# Patient Record
Sex: Female | Born: 1990
Health system: Southern US, Community
[De-identification: ages and names within clinical notes are randomized; demographics above are authoritative.]

## PROBLEM LIST (undated history)

## (undated) ENCOUNTER — Inpatient Hospital Stay (HOSPITAL_COMMUNITY): Payer: Self-pay

## (undated) ENCOUNTER — Inpatient Hospital Stay (HOSPITAL_COMMUNITY): Payer: Medicaid Other

## (undated) DIAGNOSIS — J40 Bronchitis, not specified as acute or chronic: Secondary | ICD-10-CM

## (undated) DIAGNOSIS — E119 Type 2 diabetes mellitus without complications: Secondary | ICD-10-CM

## (undated) DIAGNOSIS — E162 Hypoglycemia, unspecified: Secondary | ICD-10-CM

## (undated) HISTORY — PX: WISDOM TOOTH EXTRACTION: SHX21

## (undated) HISTORY — DX: Type 2 diabetes mellitus without complications: E11.9

## (undated) HISTORY — PX: OTHER SURGICAL HISTORY: SHX169

## (undated) NOTE — Progress Notes (Signed)
 Formatting of this note is different from the original. Subjective  Patient ID: Summer Spencer is a 14 y.o. female presenting to the Urgent Care with a chief complaint of Rash (Red itchy rash x 4 days started on right arm but now has spread to both arms & face. Pt states it is poison oak. Pt has tried otc meds with no relief.).  Poison Ivy Location:  Face arms Severity:  Moderate Onset quality:  Gradual Duration:  4 days Timing:  Constant Associated symptoms: rash    Objective  BP 114/71 (BP Location: Right arm, Patient Position: Sitting, BP Cuff Size: Adult)   Pulse 77   Temp 36.7 C (98 F) (Temporal)   Resp 20   Ht 1.575 m (5' 2)   Wt 55.8 kg (123 lb)   SpO2 100%   BMI 22.50 kg/m   Physical Exam Vitals and nursing note reviewed.  Constitutional:      Appearance: Normal appearance.  HENT:     Head: Normocephalic and atraumatic.  Eyes:     Pupils: Pupils are equal, round, and reactive to light.  Cardiovascular:     Rate and Rhythm: Normal rate and regular rhythm.  Pulmonary:     Effort: Pulmonary effort is normal.     Breath sounds: Normal breath sounds.  Skin:    General: Skin is warm and dry.     Findings: Rash present. Rash is vesicular.     Comments: Forarms and facial cheek/neck  Neurological:     General: No focal deficit present.     Mental Status: She is alert and oriented to person, place, and time.     Assessment & Plan   Assessment & Plan Rhus dermatitis  Orders:   dexAMETHasone (Decadron) injection 10 mg    In-House Lab Results:  No results found for this or any previous visit.   In-House Imaging Reads:    Procedure Documentation: Procedures   ED Course & MDM  MDM - Medical Decision Making: Home with return precautions  Patient Instructions  Pt requesting steroid shot for poison ivy on the face neck arms. Will constantly monitor  glucose and cover sugars per her sliding scale.    YOU HAVE BEEN DIAGNOSED WITH POISON IVY/OAK/SUMAC  TODAY.  TAKE THE MEDICATIONS THAT HAVE BEEN PRESCRIBED TO YOU TODAY.  YOU MAY ALSO USE CALAMINE LOTION.  IN THE FUTURE, AFTER EXPOSURE, REMOVE CLOTHING AND TAKE A SHOWER AS SOON AS POSSIBLE.  RETURN WITH ANY SIGN OF SECONDARY BACTERIAL INFECTION (REDNESS, SWELLING, DRAINAGE).  PLEASE BE AWARE THAT YOU HAVE BEEN SEEN AT AN URGENT CARE TODAY.  WE DO VERY LIMITED TESTING AND EVALUATIONS AT THIS SETTING.  WE CANNOT PERFORM ADVANCED RADIOLOGICAL TESTING AND LABORATORY TESTING.  DUE TO THIS, IF YOUR SYMPTOMS WORSEN AT ALL, DO NOT IMPROVE, OR IF YOU DEVELOP CHEST PAIN, SHORTNESS OF BREATH, ABDOMINAL PAIN, OR OTHER CONCERNING SYMPTOMS GO TO THE ER IMMEDIATELY.  FOLLOW UP WITH PCP IN 2-3 DAYS.  THE PROVIDER HAS ANSWERED ALL OF THE PATIENT'S QUESTIONS, AFTERCARE INSTRUCTIONS HAVE BEEN PROVIDED, RETURN PRECAUTIONS WERE DISCUSSED, AND THE PATIENT/GUARDIAN/CAREGIVER UNDERSTANDS AND AGREES WITH PLAN.   Electronically signed by Verneita LITTIE Boers, CRNP at 05/01/2024 10:15 PM EDT

---
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
## 2008-06-03 ENCOUNTER — Other Ambulatory Visit: Admission: RE | Admit: 2008-06-03 | Discharge: 2008-06-03 | Payer: Self-pay | Admitting: Obstetrics and Gynecology

## 2008-08-22 ENCOUNTER — Inpatient Hospital Stay (HOSPITAL_COMMUNITY): Admission: AD | Admit: 2008-08-22 | Discharge: 2008-08-22 | Payer: Self-pay | Admitting: Obstetrics & Gynecology

## 2008-12-08 ENCOUNTER — Ambulatory Visit: Payer: Self-pay | Admitting: Obstetrics and Gynecology

## 2008-12-08 ENCOUNTER — Inpatient Hospital Stay (HOSPITAL_COMMUNITY): Admission: AD | Admit: 2008-12-08 | Discharge: 2008-12-09 | Payer: Self-pay | Admitting: Obstetrics & Gynecology

## 2008-12-16 ENCOUNTER — Inpatient Hospital Stay (HOSPITAL_COMMUNITY): Admission: AD | Admit: 2008-12-16 | Discharge: 2008-12-19 | Payer: Self-pay | Admitting: Obstetrics & Gynecology

## 2008-12-16 ENCOUNTER — Ambulatory Visit: Payer: Self-pay | Admitting: Family Medicine

## 2010-12-09 LAB — URINALYSIS, ROUTINE W REFLEX MICROSCOPIC
Bilirubin Urine: NEGATIVE
Glucose, UA: NEGATIVE mg/dL
Hgb urine dipstick: NEGATIVE
Ketones, ur: NEGATIVE mg/dL
Nitrite: NEGATIVE
Protein, ur: NEGATIVE mg/dL
Specific Gravity, Urine: 1.015 (ref 1.005–1.030)
Urobilinogen, UA: 0.2 mg/dL (ref 0.0–1.0)
pH: 6.5 (ref 5.0–8.0)

## 2010-12-09 LAB — COMPREHENSIVE METABOLIC PANEL
ALT: 15 U/L (ref 0–35)
AST: 19 U/L (ref 0–37)
Albumin: 2.5 g/dL — ABNORMAL LOW (ref 3.5–5.2)
Alkaline Phosphatase: 147 U/L — ABNORMAL HIGH (ref 39–117)
BUN: 4 mg/dL — ABNORMAL LOW (ref 6–23)
CO2: 22 mEq/L (ref 19–32)
Calcium: 8.9 mg/dL (ref 8.4–10.5)
Chloride: 109 mEq/L (ref 96–112)
Creatinine, Ser: 0.4 mg/dL (ref 0.4–1.2)
GFR calc Af Amer: >60 mL/min (ref >60)
GFR calc non Af Amer: >60 mL/min (ref >60)
Glucose, Bld: 86 mg/dL (ref 70–99)
Potassium: 4 mEq/L (ref 3.5–5.1)
Sodium: 137 mEq/L (ref 135–145)
Total Bilirubin: 0.6 mg/dL (ref 0.3–1.2)
Total Protein: 4.8 g/dL — ABNORMAL LOW (ref 6.0–8.3)

## 2010-12-09 LAB — CBC
HCT: 37.5 % (ref 36.0–46.0)
HCT: 32.3 % — ABNORMAL LOW (ref 36.0–46.0)
Hemoglobin: 13.3 g/dL (ref 12.0–15.0)
Hemoglobin: 11.1 g/dL — ABNORMAL LOW (ref 12.0–15.0)
MCHC: 35.3 g/dL (ref 30.0–36.0)
MCHC: 34.5 g/dL (ref 30.0–36.0)
MCV: 99.2 fL (ref 78.0–100.0)
MCV: 99.8 fL (ref 78.0–100.0)
Platelets: 148 10*3/uL — ABNORMAL LOW (ref 150–400)
Platelets: 116 10*3/uL — ABNORMAL LOW (ref 150–400)
RBC: 3.78 MIL/uL — ABNORMAL LOW (ref 3.87–5.11)
RBC: 3.24 MIL/uL — ABNORMAL LOW (ref 3.87–5.11)
RDW: 12.9 % (ref 11.5–15.5)
RDW: 13.2 % (ref 11.5–15.5)
WBC: 10.1 10*3/uL (ref 4.0–10.5)
WBC: 7.9 10*3/uL (ref 4.0–10.5)

## 2010-12-09 LAB — URINE MICROSCOPIC-ADD ON

## 2010-12-09 LAB — GLUCOSE, CAPILLARY
Glucose-Capillary: 103 mg/dL — ABNORMAL HIGH (ref 70–99)
Glucose-Capillary: 111 mg/dL — ABNORMAL HIGH (ref 70–99)
Glucose-Capillary: 115 mg/dL — ABNORMAL HIGH (ref 70–99)
Glucose-Capillary: 137 mg/dL — ABNORMAL HIGH (ref 70–99)
Glucose-Capillary: 145 mg/dL — ABNORMAL HIGH (ref 70–99)
Glucose-Capillary: 80 mg/dL (ref 70–99)
Glucose-Capillary: 90 mg/dL (ref 70–99)
Glucose-Capillary: 91 mg/dL (ref 70–99)
Glucose-Capillary: 98 mg/dL (ref 70–99)
Glucose-Capillary: 99 mg/dL (ref 70–99)

## 2010-12-09 LAB — RPR: RPR Ser Ql: NONREACTIVE

## 2011-01-12 NOTE — Op Note (Signed)
NAMEJUANELL, Spencer             ACCOUNT NO.:  [id number]   MEDICAL RECORD NO.:  [id number]          PATIENT TYPE:  AMB   LOCATION:  [facility]                         FACILITY:  [facility]   PHYSICIAN:  M. Leda Quail, MD  DATE OF BIRTH:  [birth year]   DATE OF PROCEDURE:  03/21/2007  DATE OF DISCHARGE:                               OPERATIVE REPORT   PREOPERATIVE DIAGNOSES:  1. A 20 year old G0, white female with hymenal bands.   POSTOPERATIVE DIAGNOSIS:  1. A 20 year old G0, white female with hymenal bands.   PROCEDURE:  Hymenal band excision.   SURGEON:  M. Leda Quail, MD.   ASSISTANT:  OR staff.   ANESTHESIA:  MAC.   SPECIMENS:  None.   ESTIMATED BLOOD LOSS:  Minimal.   FLUIDS:  100 mL of LR.   URINE OUTPUT:  The patient voided right before the procedure.   COMPLICATIONS:  None.   INDICATIONS:  Ms. Summer Spencer is a 20 year old white female who has known  a history of hymenal bands.  She has become sexually active and is using  tampons and notices pulling sensation much more frequently than she did  before.  She has decided she would like to have these removed and we did  attempt to do this in the clinic.  She was intolerant of pain in the  clinic and after numbing one of the bands she was unable to complete the  procedure.  Therefore I recommended to go ahead and finished this in the  operating room.  She and her mother are in agreement and she is here for  this today.   PROCEDURE:  The patient is taken to the operating room.  MAC anesthesia  is administered by anesthesia staff without difficulty.  Legs were  positioned in [place] stirrups in the low lithotomy position.  The  perineum, inner thighs and vagina prepped in normal sterile fashion.  The patient is then draped in normal sterile fashion.  The legs slightly  lifted to a higher lithotomy position.  The posterior hymenal band that  is still remaining is elevated.  It is attached to the vaginal opening  through a  thick band.  Since I am in the OR, I decided to go ahead and  excise all of this.  A #15 blade was obtained.  This entire area of  tissue was excised sharply.  Then a 4-0 Monocryl subcuticular stitch is  used to reapproximate this tissue for hemostasis.  Cautery was then used  for complete hemostasis.  At this point the vaginal opening is smooth.  There is at least a two fingerbreadths width opening under anesthesia.  The tissue is nice and stretchy and not on any tension.  At this point  the procedure was ended.   Sponge, lap, needle, instrument counts were correct x2.  The patient  awakened from anesthesia without difficulty and taken to the recovery  room in stable condition.      Lum Keas, MD  Electronically Signed     MSM/MEDQ  D:  03/21/2007  T:  03/21/2007  Job:  [id number]

## 2011-06-04 LAB — URINALYSIS, ROUTINE W REFLEX MICROSCOPIC
Bilirubin Urine: NEGATIVE
Glucose, UA: NEGATIVE mg/dL
Hgb urine dipstick: NEGATIVE
Ketones, ur: NEGATIVE mg/dL
Nitrite: NEGATIVE
Protein, ur: NEGATIVE mg/dL
Specific Gravity, Urine: 1.015 (ref 1.005–1.030)
Urobilinogen, UA: 0.2 mg/dL (ref 0.0–1.0)
pH: 6.5 (ref 5.0–8.0)

## 2011-06-14 LAB — POCT PREGNANCY, URINE
Operator id: 268271
Preg Test, Ur: NEGATIVE

## 2011-06-14 LAB — CBC
HCT: 31.9 — ABNORMAL LOW
Hemoglobin: 11.3 — ABNORMAL LOW
MCHC: 35.4
MCV: 90.7
Platelets: 135 — ABNORMAL LOW
RBC: 3.51 — ABNORMAL LOW
RDW: 14.4 — ABNORMAL HIGH
WBC: 4.2

## 2011-08-25 LAB — OB RESULTS CONSOLE TB SKIN TEST: CHL TB SkinTest: NEGATIVE

## 2011-08-29 ENCOUNTER — Inpatient Hospital Stay (HOSPITAL_COMMUNITY)
Admission: AD | Admit: 2011-08-29 | Discharge: 2011-08-29 | Disposition: A | Discharge status: Home / Self Care | Payer: Medicaid Other | Source: Ambulatory Visit | Attending: Obstetrics & Gynecology | Admitting: Obstetrics & Gynecology

## 2011-08-29 ENCOUNTER — Inpatient Hospital Stay (HOSPITAL_COMMUNITY): Payer: Medicaid Other

## 2011-08-29 ENCOUNTER — Encounter (HOSPITAL_COMMUNITY): Payer: Self-pay

## 2011-08-29 DIAGNOSIS — N760 Acute vaginitis: Secondary | ICD-10-CM | POA: Insufficient documentation

## 2011-08-29 DIAGNOSIS — B9689 Other specified bacterial agents as the cause of diseases classified elsewhere: Secondary | ICD-10-CM | POA: Insufficient documentation

## 2011-08-29 DIAGNOSIS — A499 Bacterial infection, unspecified: Secondary | ICD-10-CM | POA: Insufficient documentation

## 2011-08-29 DIAGNOSIS — O2390 Unspecified genitourinary tract infection in pregnancy, unspecified trimester: Secondary | ICD-10-CM | POA: Insufficient documentation

## 2011-08-29 DIAGNOSIS — O209 Hemorrhage in early pregnancy, unspecified: Secondary | ICD-10-CM | POA: Insufficient documentation

## 2011-08-29 LAB — WET PREP, GENITAL
Trich, Wet Prep: NONE SEEN
Yeast Wet Prep HPF POC: NONE SEEN

## 2011-08-29 LAB — URINALYSIS, ROUTINE W REFLEX MICROSCOPIC
Bilirubin Urine: NEGATIVE
Glucose, UA: NEGATIVE mg/dL
Hgb urine dipstick: NEGATIVE
Ketones, ur: NEGATIVE mg/dL
Leukocytes, UA: NEGATIVE
Nitrite: NEGATIVE
Protein, ur: NEGATIVE mg/dL
Specific Gravity, Urine: 1.015 (ref 1.005–1.030)
Urobilinogen, UA: 0.2 mg/dL (ref 0.0–1.0)
pH: 6 (ref 5.0–8.0)

## 2011-08-29 LAB — ABO/RH: ABO/RH(D): A POS

## 2011-08-29 LAB — POCT PREGNANCY, URINE: Preg Test, Ur: POSITIVE

## 2011-08-29 MED ORDER — METRONIDAZOLE 500 MG PO TABS: 500.0000 mg | ORAL_TABLET | Freq: Two times a day (BID) | ORAL | Status: AC

## 2011-08-29 NOTE — ED Provider Notes (Signed)
History     Chief Complaint  Patient presents with  . Vaginal Bleeding    when wiping only    HPI  Onset of vaginal bleeding around 1:00 a.m, continued until 1200, only when wiping after urination.  Denies pelvic pain.  Received prenatal care at [facility]; 8 weeks, had an ultrasound last week by Dr. Emelda Fear everything wnl.   History reviewed. No pertinent past medical history.  Past Surgical History  Procedure Date  . Wisdom tooth extraction   . Hymen     History reviewed. No pertinent family history.  History  Substance Use Topics  . Smoking status: Never Smoker   . Smokeless tobacco: Not on file  . Alcohol Use: No    Allergies:  Allergies  Allergen Reactions  . Septra (Bactrim) Rash    Prescriptions prior to admission  Medication Sig Dispense Refill  . ondansetron (ZOFRAN) 4 MG tablet Take 4 mg by mouth daily as needed. nausea       . Prenatal Vit-Fe Fumarate-FA (PRENATAL MULTIVITAMIN) TABS Take 1 tablet by mouth daily.          Review of Systems  Gastrointestinal: Positive for abdominal pain ( cramping).  Genitourinary:       Vaginal bleeding  All other systems reviewed and are negative.   Physical Exam   Blood pressure 116/81, pulse 80, temperature 98.6 F (37 C), temperature source Oral, resp. rate 16, height 5\' 2"  (1.575 m), weight 57.607 kg (127 lb).  Physical Exam  Constitutional: She is oriented to person, place, and time. She appears well-developed and well-nourished.  HENT:  Head: Normocephalic.  Neck: Normal range of motion. Neck supple.  Cardiovascular: Normal rate and regular rhythm.   Respiratory: Effort normal and breath sounds normal.  GI: Soft. She exhibits no mass. There is no tenderness. There is no guarding.  Genitourinary: Cervix exhibits motion tenderness. Vaginal discharge (white, creamy; frothy) found.       +fishy odor  Neurological: She is alert and oriented to person, place, and time.  Skin: Skin is warm and dry.     MAU Course  Procedures  [place] IUP at 8wks 3 days; +fetal heart rate  Results for orders placed during the hospital encounter of 08/29/11 (from the past 24 hour(s))  URINALYSIS, ROUTINE W REFLEX MICROSCOPIC     Status: Normal   Collection Time   08/29/11  6:20 PM      Component Value Range   Color, Urine YELLOW  YELLOW    APPearance CLEAR  CLEAR    Specific Gravity, Urine 1.015  1.005 - 1.030    pH 6.0  5.0 - 8.0    Glucose, UA NEGATIVE  NEGATIVE (mg/dL)   Hgb urine dipstick NEGATIVE  NEGATIVE    Bilirubin Urine NEGATIVE  NEGATIVE    Ketones, ur NEGATIVE  NEGATIVE (mg/dL)   Protein, ur NEGATIVE  NEGATIVE (mg/dL)   Urobilinogen, UA 0.2  0.0 - 1.0 (mg/dL)   Nitrite NEGATIVE  NEGATIVE    Leukocytes, UA NEGATIVE  NEGATIVE   POCT PREGNANCY, URINE     Status: Normal   Collection Time   08/29/11  6:24 PM      Component Value Range   Preg Test, Ur POSITIVE    ABO/RH     Status: Normal   Collection Time   08/29/11  7:30 PM      Component Value Range   ABO/RH(D) A POS    WET PREP, GENITAL     Status: Abnormal  Collection Time   08/29/11  9:00 PM      Component Value Range   Yeast, Wet Prep NONE SEEN  NONE SEEN    Trich, Wet Prep NONE SEEN  NONE SEEN    Clue Cells, Wet Prep FEW (*) NONE SEEN    WBC, Wet Prep HPF POC TOO NUMEROUS TO COUNT (*) NONE SEEN      Assessment and Plan  Intrauterine Pregnancy Bleeding During Pregnancy Bacterial Vaginosis  Plan: DC to home Flagyl Keep scheduled appointment  [facility] 08/29/2011, 9:04 PM

## 2011-08-29 NOTE — Progress Notes (Signed)
Onset of vaginal bleeding around 1:00 a.m. When wiping after urination, no pain. 8 weeks, had an ultrasound last week by Dr. Emelda Fear everything wnl

## 2011-08-29 NOTE — Progress Notes (Signed)
Roney Marion, CNM at bedside.  Assessment done and poc discussed with pt.  Pelvic done.

## 2011-08-29 NOTE — Progress Notes (Signed)
V. Smith, CNM at bedside.  Assessment done and poc discussed with pt.  

## 2011-08-29 NOTE — Progress Notes (Signed)
Pt notified of waiting for [place].  Is next in line.

## 2011-08-29 NOTE — Progress Notes (Signed)
SSE per CNM.  Wet prep and  Cultures collected.

## 2011-08-29 NOTE — Progress Notes (Signed)
Pt states she always has vaginal discharge.

## 2011-08-30 LAB — GC/CHLAMYDIA PROBE AMP, GENITAL
Chlamydia, DNA Probe: NEGATIVE
GC Probe Amp, Genital: NEGATIVE

## 2011-08-30 LAB — OB RESULTS CONSOLE HEPATITIS B SURFACE ANTIGEN: Hepatitis B Surface Ag: NEGATIVE

## 2011-08-30 LAB — OB RESULTS CONSOLE RUBELLA ANTIBODY, IGM: Rubella: IMMUNE

## 2011-08-30 LAB — OB RESULTS CONSOLE HIV ANTIBODY (ROUTINE TESTING): HIV: NONREACTIVE

## 2011-08-31 NOTE — L&D Delivery Note (Signed)
I have seen and examined this patient and I agree with the above. Summer Spencer 3:26 AM 03/30/2012

## 2011-08-31 NOTE — L&D Delivery Note (Signed)
Delivery Note At 2:47 AM a viable female was delivered via Vaginal, Spontaneous Delivery (Presentation: ;OA  ).  APGAR: 5, 8; weight 9 lb 14.4 oz (4490 g).   Placenta status: Intact, Spontaneous.  Cord: 3 vessel with the following complications: shoulder dystocia x , McRoberts, suprapubic pressure used, and pt pushing w/ctx    Anesthesia: None  Episiotomy: none Lacerations: Lt labial abrasion, 1st degree perineal-hemostatic, unrepaired  Est. Blood Loss (mL): 200  Mom to postpartum.  Baby to nursery-stable. Philipp Deputy CNM present at delivery Lawernce Pitts 03/30/2012, 3:05 AM

## 2011-09-03 ENCOUNTER — Other Ambulatory Visit: Payer: Self-pay | Admitting: Obstetrics and Gynecology

## 2011-10-09 ENCOUNTER — Inpatient Hospital Stay (HOSPITAL_COMMUNITY)
Admission: AD | Admit: 2011-10-09 | Discharge: 2011-10-09 | Disposition: A | Discharge status: Home / Self Care | Payer: Medicaid Other | Source: Ambulatory Visit | Attending: Obstetrics & Gynecology | Admitting: Obstetrics & Gynecology

## 2011-10-09 ENCOUNTER — Encounter (HOSPITAL_COMMUNITY): Payer: Self-pay

## 2011-10-09 DIAGNOSIS — O99891 Other specified diseases and conditions complicating pregnancy: Secondary | ICD-10-CM | POA: Insufficient documentation

## 2011-10-09 DIAGNOSIS — J029 Acute pharyngitis, unspecified: Secondary | ICD-10-CM | POA: Insufficient documentation

## 2011-10-09 HISTORY — DX: Hypoglycemia, unspecified: E16.2

## 2011-10-09 MED ORDER — AMOXICILLIN-POT CLAVULANATE 875-125 MG PO TABS
1.0000 | ORAL_TABLET | Freq: Once | ORAL | Status: AC
  Administered 2011-10-09: 1 via ORAL
  Filled 2011-10-09: qty 1

## 2011-10-09 MED ORDER — AMOXICILLIN-POT CLAVULANATE 875-125 MG PO TABS: 1.0000 | ORAL_TABLET | Freq: Two times a day (BID) | ORAL | Status: AC

## 2011-10-09 NOTE — ED Provider Notes (Signed)
History   Pt presents today c/o a sore throat that began yesterday. She states it feels extremely "raw" and she has been having chills. She states she has been taking tylenol so she is unsure if she has had a fever. She denies abd pain, vag dc, bleeding, or any other sx at this time.  Chief Complaint  Patient presents with  . Sore Throat   HPI  OB History    Grav Para Term Preterm Abortions TAB SAB Ect Mult Living   2 1 1  0 0 0 0 0 0 1      History reviewed. No pertinent past medical history.  Past Surgical History  Procedure Date  . Wisdom tooth extraction   . Hymen     No family history on file.  History  Substance Use Topics  . Smoking status: Never Smoker   . Smokeless tobacco: Not on file  . Alcohol Use: No    Allergies:  Allergies  Allergen Reactions  . Septra (Bactrim) Rash    Prescriptions prior to admission  Medication Sig Dispense Refill  . ondansetron (ZOFRAN) 4 MG tablet Take 4 mg by mouth daily as needed. nausea       . Prenatal Vit-Fe Fumarate-FA (PRENATAL MULTIVITAMIN) TABS Take 1 tablet by mouth daily.          Review of Systems  Constitutional: Positive for chills. Negative for fever.  HENT: Positive for sore throat. Negative for hearing loss, nosebleeds and congestion.   Eyes: Negative for blurred vision and double vision.  Respiratory: Negative for cough, hemoptysis, sputum production, shortness of breath and stridor.   Cardiovascular: Negative for chest pain and palpitations.  Gastrointestinal: Negative for nausea, vomiting, abdominal pain, diarrhea and constipation.  Genitourinary: Negative for dysuria, urgency, frequency and hematuria.  Neurological: Negative for dizziness and headaches.  Psychiatric/Behavioral: Negative for depression and suicidal ideas.   Physical Exam   Blood pressure 116/63, pulse 105, temperature 98.7 F (37.1 C), temperature source Oral, resp. rate 16, height 5\' 2"  (1.575 m), weight 128 lb (58.06 kg).  Physical  Exam  Nursing note and vitals reviewed. Constitutional: She is oriented to person, place, and time. She appears well-developed and well-nourished. No distress.  HENT:  Head: Normocephalic and atraumatic.  Mouth/Throat: Uvula is midline. No oral lesions. No dental abscesses. Oropharyngeal exudate and posterior oropharyngeal erythema present. No posterior oropharyngeal edema or tonsillar abscesses.       Posterior pharynx with cobblestoning appearance.   Cardiovascular: Normal rate, regular rhythm and normal heart sounds.  Exam reveals no gallop and no friction rub.   No murmur heard. Respiratory: Effort normal and breath sounds normal. No respiratory distress. She has no wheezes. She has no rales. She exhibits no tenderness.  GI: Soft. She exhibits no distension and no mass. There is no tenderness. There is no rebound and no guarding.  Neurological: She is alert and oriented to person, place, and time.  Skin: Skin is warm and dry. She is not diaphoretic.  Psychiatric: She has a normal mood and affect. Her behavior is normal. Judgment and thought content normal.    MAU Course  Procedures    Assessment and Plan  Pharyngitis: discussed with pt at length. She likely has Strep. Pharyngitis. Will tx with Augmentin 875 bid x 10 days. Her first dose was given in the MAU. She has f/u scheduled. Discussed diet, activity, risks, and precautions.  Clinton Gallant. Serigne Kubicek III, DrHSc, MPAS, PA-C  10/09/2011, 6:23 PM   Henrietta Hoover, PA  10/09/11 1829 

## 2011-10-09 NOTE — Progress Notes (Signed)
Onset of sore throat since yesterday, chills, body aches. 14 weeks.

## 2011-10-09 NOTE — ED Notes (Signed)
Henrietta Hoover PA into see patient, visualization of throat.

## 2011-10-14 ENCOUNTER — Inpatient Hospital Stay (HOSPITAL_COMMUNITY)
Admission: AD | Admit: 2011-10-14 | Discharge: 2011-10-14 | Disposition: A | Discharge status: Home / Self Care | Payer: Medicaid Other | Source: Ambulatory Visit | Attending: Obstetrics & Gynecology | Admitting: Obstetrics & Gynecology

## 2011-10-14 ENCOUNTER — Encounter (HOSPITAL_COMMUNITY): Payer: Self-pay

## 2011-10-14 ENCOUNTER — Inpatient Hospital Stay (HOSPITAL_COMMUNITY): Payer: Medicaid Other

## 2011-10-14 DIAGNOSIS — R51 Headache: Secondary | ICD-10-CM

## 2011-10-14 DIAGNOSIS — R519 Headache, unspecified: Secondary | ICD-10-CM

## 2011-10-14 DIAGNOSIS — G43909 Migraine, unspecified, not intractable, without status migrainosus: Secondary | ICD-10-CM | POA: Insufficient documentation

## 2011-10-14 DIAGNOSIS — O26899 Other specified pregnancy related conditions, unspecified trimester: Secondary | ICD-10-CM

## 2011-10-14 DIAGNOSIS — O99891 Other specified diseases and conditions complicating pregnancy: Secondary | ICD-10-CM | POA: Insufficient documentation

## 2011-10-14 MED ORDER — SUMATRIPTAN SUCCINATE 50 MG PO TABS
50.0000 mg | ORAL_TABLET | ORAL | Status: DC | PRN
  Filled 2011-10-14: qty 1

## 2011-10-14 MED ORDER — SUMATRIPTAN SUCCINATE 50 MG PO TABS
50.0000 mg | ORAL_TABLET | Freq: Once | ORAL | Status: AC
  Administered 2011-10-14: 50 mg via ORAL

## 2011-10-14 MED ORDER — SUMATRIPTAN SUCCINATE 50 MG PO TABS
50.0000 mg | ORAL_TABLET | Freq: Once | ORAL | Status: AC
  Administered 2011-10-14: 50 mg via ORAL
  Filled 2011-10-14: qty 1

## 2011-10-14 MED ORDER — ONDANSETRON 8 MG PO TBDP
8.0000 mg | ORAL_TABLET | Freq: Once | ORAL | Status: AC
  Administered 2011-10-14: 8 mg via ORAL
  Filled 2011-10-14: qty 1

## 2011-10-14 MED ORDER — HYDROMORPHONE HCL PF 1 MG/ML IJ SOLN
1.0000 mg | Freq: Once | INTRAMUSCULAR | Status: AC
  Administered 2011-10-14: 1 mg via INTRAMUSCULAR
  Filled 2011-10-14: qty 1

## 2011-10-14 NOTE — Progress Notes (Signed)
Patient states she has been having headaches on the right side of her head behind the right eye for about 3 weeks, getting worse and more frequent. Throbbing and worse when moving. Has vomiting at least once a day, no a new symptom.

## 2011-10-14 NOTE — Discharge Instructions (Signed)
Headache, General, Unknown Cause The specific cause of your headache may not have been found today. There are many causes and types of headache. A few common ones are:  Tension headache.   Migraine.   Infections (examples: dental and sinus infections).   Bone and/or joint problems in the neck or jaw.   Depression.   Eye problems.  These headaches are not life threatening.  Headaches can sometimes be diagnosed by a patient history and a physical exam. Sometimes, lab and imaging studies (such as x-ray and/or CT scan) are used to rule out more serious problems. In some cases, a spinal tap (lumbar puncture) may be requested. There are many times when your exam and tests may be normal on the first visit even when there is a serious problem causing your headaches. Because of that, it is very important to follow up with your doctor or local clinic for further evaluation. FINDING OUT THE RESULTS OF TESTS  If a radiology test was performed, a radiologist will review your results.   You will be contacted by the emergency department or your physician if any test results require a change in your treatment plan.   Not all test results may be available during your visit. If your test results are not back during the visit, make an appointment with your caregiver to find out the results. Do not assume everything is normal if you have not heard from your caregiver or the medical facility. It is important for you to follow up on all of your test results.  HOME CARE INSTRUCTIONS   Keep follow-up appointments with your caregiver, or any specialist referral.   Only take over-the-counter or prescription medicines for pain, discomfort, or fever as directed by your caregiver.   Biofeedback, massage, or other relaxation techniques may be helpful.   Ice packs or heat applied to the head and neck can be used. Do this three to four times per day, or as needed.   Call your doctor if you have any questions or  concerns.   If you smoke, you should quit.  SEEK MEDICAL CARE IF:   You develop problems with medications prescribed.   You do not respond to or obtain relief from medications.   You have a change from the usual headache.   You develop nausea or vomiting.  SEEK IMMEDIATE MEDICAL CARE IF:   If your headache becomes severe.   You have an unexplained oral temperature above 102 F (38.9 C), or as your caregiver suggests.   You have a stiff neck.   You have loss of vision.   You have muscular weakness.   You have loss of muscular control.   You develop severe symptoms different from your first symptoms.   You start losing your balance or have trouble walking.   You feel faint or pass out.  MAKE SURE YOU:   Understand these instructions.   Will watch your condition.   Will get help right away if you are not doing well or get worse.  Document Released: 08/16/2005 Document Revised: 04/28/2011 Document Reviewed: 04/04/2008 [facility] Patient Information 2012 [place], [place].\  CALL YOUR DOCTOR SHOULD YOU HAVE INCREASED PAIN, FACIAL PAIN OR CONGESTION

## 2011-10-14 NOTE — ED Provider Notes (Signed)
History     Chief Complaint  Patient presents with  . Headache   HPI Summer Spencer is 21 y.o. G2P1001 [redacted]w[redacted]d weeks presenting with headache X 3 weeks, now has daily headache and at night it is throbbing.  Reports having 3 migraines "in her life".    Has taken 2 tylenol tabs today without relief.  Patient of [facility].  Called the office, came here because it was closer.  Still has morning sickness if she eats too early.  Points to right side of her head.      Past Medical History  Diagnosis Date  . Hypoglycemia     Past Surgical History  Procedure Date  . Wisdom tooth extraction   . Hymen     Family History  Problem Relation Age of Onset  . Anesthesia problems Neg Hx     History  Substance Use Topics  . Smoking status: Former Smoker    Quit date: 08/08/2011  . Smokeless tobacco: Not on file  . Alcohol Use: No    Allergies:  Allergies  Allergen Reactions  . Septra (Bactrim) Rash    Prescriptions prior to admission  Medication Sig Dispense Refill  . Acetaminophen (TYLENOL SORE THROAT DAYTIME) 167 MG/5ML LIQD Take 30 mLs by mouth daily as needed. Sore throat      . amoxicillin-clavulanate (AUGMENTIN) 875-125 MG per tablet Take 1 tablet by mouth 2 (two) times daily.  20 tablet  0  . ondansetron (ZOFRAN) 4 MG tablet Take 4 mg by mouth daily as needed. nausea       . Prenatal Vit-Fe Fumarate-FA (PRENATAL MULTIVITAMIN) TABS Take 1 tablet by mouth daily.          Review of Systems  Eyes: Positive for photophobia.  Gastrointestinal: Positive for vomiting (morning sickness).  Neurological: Positive for headaches (right temporal ).   Physical Exam   Blood pressure 122/77, pulse 101, temperature 98.4 F (36.9 C), temperature source Oral, resp. rate 16, height 5\' 3"  (1.6 m), weight 58.695 kg (129 lb 6.4 oz), SpO2 100.00%.  Physical Exam  Constitutional: She is oriented to person, place, and time. She appears well-developed and well-nourished.   Uncomfortable,squinting  Eyes:       photophobic  Neurological: She is alert and oriented to person, place, and time. No cranial nerve deficit. Coordination normal.  Skin: Skin is warm and dry.   CT HEAD WITHOUT CONTRAST  Technique: Contiguous axial images were obtained from the base of  the skull through the vertex without contrast.  Comparison: None  Findings:  Abdomen shielded.  Normal ventricular morphology.  No midline shift or mass effect.  Normal appearance of brain parenchyma.  No intracranial hemorrhage, mass lesion, or evidence of acute  infarction.  No extra-axial fluid collections.  Osseous structures unremarkable.  Partial opacification of ethmoid air cells and sphenoid sinus with  sub total opacification of the right maxillary sinus apex question  sinusitis.  IMPRESSION:  No acute intracranial abnormalities.  Question right maxillary sinusitis.  Original Report Authenticated By: Lollie Marrow, M.D.       MAU Course  Procedures  MDM 16:00 Discussed patient's sxs and physical findings with Dr. Marice Potter.  Order given for Imitrex 50 mg po and Dilaudid 1mg  IM now.  May repeat Imitrex in 2 hours.  If not improvement, consider CT.  17:10 Patient called out reporting vomiting.  Zofran ordered. 17:40  Patient states her headache is not better and her head feels heavy.  I reported this  to Dr. Jolayne Panther.  Give second imitrex when due and order CT without contrast.  18:30  Can give Imitrex now, asked patient if she would like second dose.  She prefers to wait.   19:06  Reported CT results to Dr. Jolayne Panther.  May discharge to home.  Will offer second imitrex now.  Patient would like the second Imitrex.  Caution if she gets facial pain or sinus congestion, she need to call [facility] for treatment.  Patient states she was treated this weekend for strept throat with Amoxicillin Assessment and Plan  A:  Migraine headache in 2nd trimester pregnancy  P:  Instructed to call her doctor  for recurrent headache or sinus congestion/facial pain      En couraged to stay well hydrated. Durrell Barajas,EVE M 10/14/2011, 3:46 PM   Matt Holmes, NP 10/14/11 1915

## 2011-11-12 ENCOUNTER — Other Ambulatory Visit: Payer: Self-pay | Admitting: Obstetrics and Gynecology

## 2011-12-18 ENCOUNTER — Encounter (HOSPITAL_COMMUNITY): Payer: Self-pay

## 2011-12-18 ENCOUNTER — Inpatient Hospital Stay (HOSPITAL_COMMUNITY)
Admission: AD | Admit: 2011-12-18 | Discharge: 2011-12-18 | Disposition: A | Discharge status: Home / Self Care | Payer: Medicaid Other | Source: Ambulatory Visit | Attending: Obstetrics & Gynecology | Admitting: Obstetrics & Gynecology

## 2011-12-18 DIAGNOSIS — N6452 Nipple discharge: Secondary | ICD-10-CM

## 2011-12-18 DIAGNOSIS — N6459 Other signs and symptoms in breast: Secondary | ICD-10-CM

## 2011-12-18 NOTE — MAU Provider Note (Signed)
  History     CSN: [id number]  Arrival date and time: 12/18/11 1746   First Provider Initiated Contact with Patient 12/18/11 1810      Chief Complaint  Patient presents with  . Breast Problem   HPI  Pt here with reports of blood from left breast today.  Denies breast pain or mass.  No bleeding at this time.    Past Medical History  Diagnosis Date  . Hypoglycemia     Past Surgical History  Procedure Date  . Wisdom tooth extraction   . Hymen     Family History  Problem Relation Age of Onset  . Anesthesia problems Neg Hx     History  Substance Use Topics  . Smoking status: Former Smoker    Quit date: 08/08/2011  . Smokeless tobacco: Not on file  . Alcohol Use: No    Allergies:  Allergies  Allergen Reactions  . Septra (Bactrim) Rash    Prescriptions prior to admission  Medication Sig Dispense Refill  . Cefuroxime Axetil (CEFTIN PO) Take 1 tablet by mouth 2 (two) times daily. Pt does not know strength; pharmacy closed at time.  Started 12/18/11 for 7 day course.      Marland Kitchen FLUCONAZOLE PO Take 1 tablet by mouth daily. Pt does not know strength; pharmacy closed at time.  Pt states she was instructed to resume only after completing Ceftin.      . Prenatal Vit-Fe Fumarate-FA (PRENATAL MULTIVITAMIN) TABS Take 1 tablet by mouth daily.          Review of Systems  Skin:       Left breast bleeding  All other systems reviewed and are negative.   Physical Exam   Blood pressure 121/66, pulse 104, temperature 97.5 F (36.4 C), temperature source Oral, resp. rate 16, height 5\' 2"  (1.575 m), weight 61.145 kg (134 lb 12.8 oz).  Physical Exam  Constitutional: She is oriented to person, place, and time. She appears well-developed and well-nourished. No distress.  HENT:  Head: Normocephalic.  Neck: Normal range of motion. Neck supple.  Respiratory: Right breast exhibits no inverted nipple, no mass, no nipple discharge, no skin change and no tenderness. Left breast exhibits no  inverted nipple, no mass, no nipple discharge, no skin change and no tenderness.  Neurological: She is alert and oriented to person, place, and time. She has normal reflexes.  Skin: Skin is warm and dry.    MAU Course  Procedures  Consulted with Dr. Marice Potter > pt to follow-up with a diagnostic ultrasound Assessment and Plan  Breast Discharge  Plan: Refer to [facility] Breast ultrasound  [facility] 12/18/2011, 6:29 PM

## 2011-12-18 NOTE — MAU Note (Signed)
Patient reports having episode of bleeding from left breast earlier today, no bleeding since denies discomfort in breasts, on medication for yeast and UTI currently, [redacted] weeks gestation.

## 2011-12-18 NOTE — ED Notes (Signed)
No bleeding noted from breast no pain or tenderness with palpation

## 2012-01-19 ENCOUNTER — Encounter: Payer: Medicaid Other | Attending: Obstetrics & Gynecology | Admitting: Dietician

## 2012-01-19 DIAGNOSIS — Z713 Dietary counseling and surveillance: Secondary | ICD-10-CM | POA: Insufficient documentation

## 2012-01-19 DIAGNOSIS — O99810 Abnormal glucose complicating pregnancy: Secondary | ICD-10-CM | POA: Insufficient documentation

## 2012-01-20 ENCOUNTER — Encounter: Payer: Self-pay | Admitting: Dietician

## 2012-01-20 NOTE — Progress Notes (Signed)
  Patient was seen on 01/19/2012 for Gestational Diabetes self-management class at the [facility]. The following learning objectives were met by the patient during this course:   States the definition of Gestational Diabetes  States why dietary management is important in controlling blood glucose  Describes the effects each nutrient has on blood glucose levels  Demonstrates ability to create a balanced meal plan  Demonstrates carbohydrate counting   States when to check blood glucose levels  Demonstrates proper blood glucose monitoring techniques  States the effect of stress and exercise on blood glucose levels  States the importance of limiting caffeine and abstaining from alcohol and smoking  Blood glucose monitor given: Accu-Chek Smartview Meter Kit Lot # [id number] Exp: 02/26/2013 Blood glucose reading: 83 mg at 10:55 AM  Patient instructed to monitor glucose levels:Fasting and 2 hrs after first bite of each meal. FBS: 60 - <90 2 hour: <120  Patient received handouts:  Nutrition Diabetes and Pregnancy  Carbohydrate Counting List  Patient will be seen for follow-up as needed.

## 2012-01-24 ENCOUNTER — Inpatient Hospital Stay (HOSPITAL_COMMUNITY)
Admission: AD | Admit: 2012-01-24 | Discharge: 2012-01-24 | Disposition: A | Discharge status: Home / Self Care | Payer: Medicaid Other | Source: Ambulatory Visit | Attending: Obstetrics & Gynecology | Admitting: Obstetrics & Gynecology

## 2012-01-24 ENCOUNTER — Encounter (HOSPITAL_COMMUNITY): Payer: Self-pay | Admitting: *Deleted

## 2012-01-24 DIAGNOSIS — O99891 Other specified diseases and conditions complicating pregnancy: Secondary | ICD-10-CM | POA: Insufficient documentation

## 2012-01-24 DIAGNOSIS — R05 Cough: Secondary | ICD-10-CM | POA: Insufficient documentation

## 2012-01-24 DIAGNOSIS — R059 Cough, unspecified: Secondary | ICD-10-CM | POA: Insufficient documentation

## 2012-01-24 DIAGNOSIS — R058 Other specified cough: Secondary | ICD-10-CM

## 2012-01-24 DIAGNOSIS — M79609 Pain in unspecified limb: Secondary | ICD-10-CM | POA: Insufficient documentation

## 2012-01-24 HISTORY — DX: Bronchitis, not specified as acute or chronic: J40

## 2012-01-24 MED ORDER — GUAIFENESIN ER 600 MG PO TB12: 600.0000 mg | ORAL_TABLET | Freq: Two times a day (BID) | ORAL | Status: DC | PRN

## 2012-01-24 MED ORDER — AEROCHAMBER PLUS W/MASK MISC
1.0000 | Freq: Once | Status: DC
  Filled 2012-01-24: qty 1

## 2012-01-24 MED ORDER — GUAIFENESIN ER 600 MG PO TB12
600.0000 mg | ORAL_TABLET | Freq: Two times a day (BID) | ORAL | Status: DC | PRN
  Filled 2012-01-24: qty 1

## 2012-01-24 MED ORDER — ALBUTEROL SULFATE HFA 108 (90 BASE) MCG/ACT IN AERS: 2.0000 | INHALATION_SPRAY | RESPIRATORY_TRACT | Status: DC | PRN

## 2012-01-24 MED ORDER — AEROCHAMBER PLUS W/MASK MISC: Status: DC

## 2012-01-24 MED ORDER — ALBUTEROL SULFATE HFA 108 (90 BASE) MCG/ACT IN AERS
2.0000 | INHALATION_SPRAY | RESPIRATORY_TRACT | Status: DC | PRN
  Filled 2012-01-24: qty 6.7

## 2012-01-24 NOTE — MAU Note (Addendum)
Ongoing cough, almost 3 wks.  Non- productive, pain in ribs now on left side, esp when coughs.  Finished meds on Saturday, denies fever. Pelvic pressure when walks.

## 2012-01-24 NOTE — MAU Note (Signed)
States wants to make sure everything is OK. Cough continues but is dry now. Feels pressure in lower abdomen. States she has a pulled muscle, points to R groin.Has been using pregnancy support belt and states it helps some.  Worse with walking. States she probably could not walk without the belt.

## 2012-01-24 NOTE — MAU Provider Note (Signed)
Seen by me. Agree with note 

## 2012-01-24 NOTE — Discharge Instructions (Signed)
Please follow up with your Primary OB as previously scheduled.  Your cough can take up to 4 weeks to go away following an upper respiratory infection.  I have sent in a prescription for an albuterol inhaler and mucinex to help with your symptoms.  If the prescription for mucinex is more expensive than the over the counter type, use the least expensive option.  Cough, Adult  A cough is a reflex that helps clear your throat and airways. It can help heal the body or may be a reaction to an irritated airway. A cough may only last 2 or 3 weeks (acute) or may last more than 8 weeks (chronic).  CAUSES Acute cough:  Viral or bacterial infections.  Chronic cough:  Infections.   Allergies.   Asthma.   Post-nasal drip.   Smoking.   Heartburn or acid reflux.   Some medicines.   Chronic lung problems (COPD).   Cancer.  SYMPTOMS   Cough.   Fever.   Chest pain.   Increased breathing rate.   High-pitched whistling sound when breathing (wheezing).   Colored mucus that you cough up (sputum).  TREATMENT   A bacterial cough may be treated with antibiotic medicine.   A viral cough must run its course and will not respond to antibiotics.   Your caregiver may recommend other treatments if you have a chronic cough.  HOME CARE INSTRUCTIONS   Only take over-the-counter or prescription medicines for pain, discomfort, or fever as directed by your caregiver. Use cough suppressants only as directed by your caregiver.   Use a cold steam vaporizer or humidifier in your bedroom or home to help loosen secretions.   Sleep in a semi-upright position if your cough is worse at night.   Rest as needed.   Stop smoking if you smoke.  SEEK IMMEDIATE MEDICAL CARE IF:   You have pus in your sputum.   Your cough starts to worsen.   You cannot control your cough with suppressants and are losing sleep.   You begin coughing up blood.   You have difficulty breathing.   You develop pain which is  getting worse or is uncontrolled with medicine.   You have a fever.  MAKE SURE YOU:   Understand these instructions.   Will watch your condition.   Will get help right away if you are not doing well or get worse.  Document Released: 02/12/2011 Document Revised: 08/05/2011 Document Reviewed: 02/12/2011 [facility] Patient Information 2012 [place], [place].Cough, Adult  A cough is a reflex that helps clear your throat and airways. It can help heal the body or may be a reaction to an irritated airway. A cough may only last 2 or 3 weeks (acute) or may last more than 8 weeks (chronic).  CAUSES Acute cough:  Viral or bacterial infections.  Chronic cough:  Infections.   Allergies.   Asthma.   Post-nasal drip.   Smoking.   Heartburn or acid reflux.   Some medicines.   Chronic lung problems (COPD).   Cancer.  SYMPTOMS   Cough.   Fever.   Chest pain.   Increased breathing rate.   High-pitched whistling sound when breathing (wheezing).   Colored mucus that you cough up (sputum).  TREATMENT   A bacterial cough may be treated with antibiotic medicine.   A viral cough must run its course and will not respond to antibiotics.   Your caregiver may recommend other treatments if you have a chronic cough.  HOME CARE INSTRUCTIONS  Only take over-the-counter or prescription medicines for pain, discomfort, or fever as directed by your caregiver. Use cough suppressants only as directed by your caregiver.   Use a cold steam vaporizer or humidifier in your bedroom or home to help loosen secretions.   Sleep in a semi-upright position if your cough is worse at night.   Rest as needed.   Stop smoking if you smoke.  SEEK IMMEDIATE MEDICAL CARE IF:   You have pus in your sputum.   Your cough starts to worsen.   You cannot control your cough with suppressants and are losing sleep.   You begin coughing up blood.   You have difficulty breathing.   You develop pain which is  getting worse or is uncontrolled with medicine.   You have a fever.  MAKE SURE YOU:   Understand these instructions.   Will watch your condition.   Will get help right away if you are not doing well or get worse.  Document Released: 02/12/2011 Document Revised: 08/05/2011 Document Reviewed: 02/12/2011 [facility] Patient Information 2012 [employer], [place].

## 2012-01-24 NOTE — MAU Provider Note (Signed)
History     CSN: [id number]  Arrival date and time: 01/24/12 1533   None   Chief Complaint  Patient presents with  . Cough   HPI Comments: Pt recently treated by Dr. For bronchitis.  Completed 10 day course of ABX.  Used sudafed, zyrtec and mucinex at beginning of illness > 2 weeks ago.  Cough has been persistent.  Also reports R groin pain with pelvic pressure, no bleeding, no discharge, no dysuria.  No falls, ?myralgia paresthetica distribution over medial femoral cutaneous nerve distribution.   Cough This is a chronic problem. The current episode started 1 to 4 weeks ago. The problem has been unchanged. The problem occurs constantly. The cough is non-productive (non bloody). Associated symptoms include chest pain, myalgias and nasal congestion. Pertinent negatives include no chills, ear congestion, ear pain, fever, headaches, heartburn, hemoptysis, postnasal drip, rash, rhinorrhea, sore throat, shortness of breath, sweats, weight loss or wheezing. Associated symptoms comments: L sided rib pain. The symptoms are aggravated by nothing. She has tried prescription cough suppressant for the symptoms. The treatment provided no relief. There is no history of asthma, bronchiectasis, bronchitis, COPD, emphysema or pneumonia.    OB History    Grav Para Term Preterm Abortions TAB SAB Ect Mult Living   2 1 1  0 0 0 0 0 0 1      Past Medical History  Diagnosis Date  . Hypoglycemia   . Bronchitis     Past Surgical History  Procedure Date  . Wisdom tooth extraction   . Hymen     Family History  Problem Relation Age of Onset  . Anesthesia problems Neg Hx   . Hypertension Other   . Hyperlipidemia Other     History  Substance Use Topics  . Smoking status: Former Smoker    Quit date: 08/08/2011  . Smokeless tobacco: Never Used  . Alcohol Use: No    Allergies:  Allergies  Allergen Reactions  . Septra (Bactrim) Rash    Prescriptions prior to admission  Medication Sig  Dispense Refill  . Cefuroxime Axetil (CEFTIN PO) Take 1 tablet by mouth 2 (two) times daily. Pt does not know strength; pharmacy closed at time.  Started 12/18/11 for 7 day course.      Marland Kitchen FLUCONAZOLE PO Take 1 tablet by mouth daily. Pt does not know strength; pharmacy closed at time.  Pt states she was instructed to resume only after completing Ceftin.      Marland Kitchen HYDROcodone-homatropine (HYCODAN) 5-1.5 MG/5ML syrup Take 2.5 mLs by mouth every 8 (eight) hours as needed. Taking the cough syrup for aggravating cough      . iron polysaccharides (NIFEREX 60) 40-20 MG capsule Take 1 capsule by mouth daily with breakfast.      . Prenatal Vit-Fe Fumarate-FA (PRENATAL MULTIVITAMIN) TABS Take 1 tablet by mouth daily.          Review of Systems  Constitutional: Negative for fever, chills and weight loss.  HENT: Negative for ear pain, sore throat, rhinorrhea and postnasal drip.   Respiratory: Positive for cough. Negative for hemoptysis, shortness of breath and wheezing.   Cardiovascular: Positive for chest pain.  Gastrointestinal: Negative for heartburn.  Musculoskeletal: Positive for myalgias. Negative for falls.       R inguinal pain that occasionally radiates to medial thigh  Skin: Negative for rash.  Neurological: Negative.  Negative for headaches.  Psychiatric/Behavioral: Negative.    Physical Exam   Blood pressure 114/67, pulse 88, temperature 98.4 F (36.9  C), temperature source Oral, resp. rate 20, height 5' 2.5" (1.588 m), weight 63.957 kg (141 lb), SpO2 98.00%.  Physical Exam  Constitutional: She appears well-developed and well-nourished. No distress.  HENT:  Head: Normocephalic and atraumatic.  Nose: Nose normal.  Eyes: Pupils are equal, round, and reactive to light. Right eye exhibits no discharge. Left eye exhibits no discharge. No scleral icterus.  Cardiovascular: Normal rate, regular rhythm and intact distal pulses.  Exam reveals no gallop and no friction rub.   No murmur  heard. Respiratory: Effort normal and breath sounds normal. She has no wheezes. She has no rales. She exhibits tenderness.       Persistent dry, non-productive cough during exam  GI: Soft. Bowel sounds are normal. She exhibits distension and mass. There is no tenderness. There is no rebound and no guarding.  Musculoskeletal:       R pseudoshort leg, L posterior ribs 6-8; T6-T8 rLsR  Skin: Skin is warm and dry. She is not diaphoretic.  Psychiatric: She has a normal mood and affect. Her behavior is normal. Judgment and thought content normal.    MAU Course  Procedures  MDM Pt is afebrile with non-productive cough s/p 10 day ABX treatment for bronchitis - very low likelihood of pneumonia given normal lung exam and afebrile.  LE pain likely pregnancy associated; ?myralgia paresthetica given distribution.  Assessment and Plan  Persistent Cough s/p bronchitis - will Rx Albuterol Inhaler for short course + mucinex - OMT on ribs + thoracic + lymphatics provided in MAU R Groin Pain; R anterior innominate treated with OMT - pt reported improvement in symptoms prior to discharge

## 2012-02-03 ENCOUNTER — Encounter (HOSPITAL_COMMUNITY): Payer: Self-pay | Admitting: *Deleted

## 2012-02-03 ENCOUNTER — Inpatient Hospital Stay (HOSPITAL_COMMUNITY)
Admission: AD | Admit: 2012-02-03 | Discharge: 2012-02-03 | Disposition: A | Discharge status: Home / Self Care | Payer: Medicaid Other | Source: Ambulatory Visit | Attending: Obstetrics & Gynecology | Admitting: Obstetrics & Gynecology

## 2012-02-03 DIAGNOSIS — S2329XA Dislocation of other parts of thorax, initial encounter: Secondary | ICD-10-CM

## 2012-02-03 DIAGNOSIS — R059 Cough, unspecified: Secondary | ICD-10-CM | POA: Insufficient documentation

## 2012-02-03 DIAGNOSIS — R0989 Other specified symptoms and signs involving the circulatory and respiratory systems: Secondary | ICD-10-CM | POA: Insufficient documentation

## 2012-02-03 DIAGNOSIS — R05 Cough: Secondary | ICD-10-CM | POA: Insufficient documentation

## 2012-02-03 DIAGNOSIS — R0609 Other forms of dyspnea: Secondary | ICD-10-CM | POA: Insufficient documentation

## 2012-02-03 DIAGNOSIS — R109 Unspecified abdominal pain: Secondary | ICD-10-CM | POA: Insufficient documentation

## 2012-02-03 MED ORDER — HYDROCODONE-ACETAMINOPHEN 5-325 MG PO TABS: 1.0000 | ORAL_TABLET | Freq: Four times a day (QID) | ORAL | Status: AC | PRN

## 2012-02-03 MED ORDER — CYCLOBENZAPRINE HCL 10 MG PO TABS: 10.0000 mg | ORAL_TABLET | Freq: Two times a day (BID) | ORAL | Status: AC | PRN

## 2012-02-03 NOTE — MAU Provider Note (Signed)
History     CSN: [id number]  Arrival date and time: 02/03/12 1104   None     Chief Complaint  Patient presents with  . Abdominal Pain   HPI Comments: Pt presents following acute rib pain following coughing episode.  Pt was recently treated for bronchitis and had been recovering well following MAU visit last week.  States cough had largely resolved.  This AM had minor coughing episode and felt a pop in the L anterior aspect the inferior aspect of her rib cage.  Had intense pain at the time and reports dyspnea associated with certain movements.  No dyspnea at rest, no conversational dyspnea.    Earlier this AM reports decreased fetal movement but had resolved prior to arrival to MAU.      OB History    Grav Para Term Preterm Abortions TAB SAB Ect Mult Living   2 1 1  0 0 0 0 0 0 1      Past Medical History  Diagnosis Date  . Hypoglycemia   . Bronchitis     Past Surgical History  Procedure Date  . Wisdom tooth extraction   . Hymen     Family History  Problem Relation Age of Onset  . Anesthesia problems Neg Hx   . Hypertension Other   . Hyperlipidemia Other     History  Substance Use Topics  . Smoking status: Former Smoker    Quit date: 08/08/2011  . Smokeless tobacco: Never Used  . Alcohol Use: No    Allergies:  Allergies  Allergen Reactions  . Septra (Bactrim) Rash    Prescriptions prior to admission  Medication Sig Dispense Refill  . acetaminophen (TYLENOL) 500 MG tablet Take 500 mg by mouth every 6 (six) hours as needed. For headache.      . albuterol (PROVENTIL HFA;VENTOLIN HFA) 108 (90 BASE) MCG/ACT inhaler Inhale 2 puffs into the lungs every 4 (four) hours as needed for wheezing or shortness of breath (cough).  1 Inhaler  0  . guaiFENesin (MUCINEX) 600 MG 12 hr tablet Take 1 tablet (600 mg total) by mouth 2 (two) times daily as needed.  90 tablet  0  . HYDROcodone-homatropine (HYCODAN) 5-1.5 MG/5ML syrup Take 2.5 mLs by mouth every 8 (eight) hours as  needed. Taking the cough syrup for aggravating cough      . iron polysaccharides (NIFEREX 60) 40-20 MG capsule Take 1 capsule by mouth daily with breakfast.      . Prenatal Vit-Fe Fumarate-FA (PRENATAL MULTIVITAMIN) TABS Take 1 tablet by mouth daily.        Marland Kitchen Spacer/Aero-Holding Chambers (AEROCHAMBER PLUS WITH MASK) inhaler Use with albuterol inhaler  1 each  0    Review of Systems  Constitutional: Negative for fever and chills.  HENT: Negative for congestion.   Eyes: Negative for blurred vision and double vision.  Respiratory: Positive for cough (occasional but much improved) and shortness of breath (pain associated dyspnea). Negative for sputum production and wheezing.   Cardiovascular: Positive for chest pain (L anterior Rib pain). Negative for palpitations, orthopnea, claudication and leg swelling.  Gastrointestinal: Negative for heartburn, nausea, vomiting and abdominal pain.  Genitourinary: Negative.   Musculoskeletal: Positive for back pain (improved from prior visit).  Skin: Negative for itching.  Neurological: Negative.   Endo/Heme/Allergies: Negative.   Psychiatric/Behavioral: Negative.    Physical Exam   Blood pressure 117/62, pulse 103, temperature 98.3 F (36.8 C), temperature source Oral, resp. rate 16, SpO2 98.00%.  Physical Exam  Constitutional:  She is oriented to person, place, and time. She appears well-developed and well-nourished. No distress.  HENT:  Head: Normocephalic and atraumatic.  Eyes: Right eye exhibits no discharge. Left eye exhibits no discharge. No scleral icterus.  Cardiovascular: Normal rate, regular rhythm, normal heart sounds and intact distal pulses.  Exam reveals no gallop and no friction rub.   No murmur heard. Respiratory: Effort normal and breath sounds normal. No respiratory distress. She has no wheezes. She has no rales. She exhibits tenderness.       Mild splinting with deep inspiration   GI: Soft. Bowel sounds are normal. She exhibits  distension and mass. There is no tenderness. There is no rebound and no guarding.  Genitourinary:       deferred  Musculoskeletal: She exhibits no edema.       TTP over Posterior L rib 10 with posterior subluxation.  TTP over L inferior costochondral margin Muscle spasm of L Quadratus Lumborum   Neurological: She is alert and oriented to person, place, and time.  Skin: Skin is warm and dry. No rash noted. She is not diaphoretic. No erythema. No pallor.  Psychiatric: She has a normal mood and affect. Her behavior is normal. Judgment and thought content normal.    MAU Course  Procedures  MDM History and physical consistent with costochondral separation vs fracture.  No evidence of significant pneumothorax, no baseline dyspnea, no hypotension, no hypoxia.  Assessment and Plan  Costochondral separation vs fracture.  Provide muscle relaxer and Vicodin for temporary pain relief.   Andrena Mews, DO [facility] Resident - PGY-1 02/03/2012 12:06 PM   Patient seen and examined.  Agree with above note.  Levie Heritage, DO 02/03/2012 12:45 PM

## 2012-02-03 NOTE — Discharge Instructions (Signed)
Costochondritis  Costochondritis (Tietze syndrome), or costochondral separation, is a swelling and irritation (inflammation) of the tissue (cartilage) that connects your ribs with your breastbone (sternum). It may occur on its own (spontaneously), through damage caused by an accident (trauma), or simply from coughing or minor exercise. It may take up to 6 weeks to get better and longer if you are unable to be conservative in your activities.  HOME CARE INSTRUCTIONS    Avoid exhausting physical activity. Try not to strain your ribs during normal activity. This would include any activities using chest, belly (abdominal), and side muscles, especially if heavy weights are used.   Use ice for 15 to 20 minutes per hour while awake for the first 2 days. Place the ice in a plastic bag, and place a towel between the bag of ice and your skin.   Only take over-the-counter or prescription medicines for pain, discomfort, or fever as directed by your caregiver.  SEEK IMMEDIATE MEDICAL CARE IF:    Your pain increases or you are very uncomfortable.   You have a fever.   You develop difficulty with your breathing.   You cough up blood.   You develop worse chest pains, shortness of breath, sweating, or vomiting.   You develop new, unexplained problems (symptoms).  MAKE SURE YOU:    Understand these instructions.   Will watch your condition.   Will get help right away if you are not doing well or get worse.  Document Released: 05/26/2005 Document Revised: 08/05/2011 Document Reviewed: 04/03/2008  [employer] Patient Information 2012 [employer].

## 2012-02-03 NOTE — MAU Note (Signed)
Pt states she is recovering from bronchitis and she coughed this morning and felt a pop in her left upper rib cage. States a decrease in fetal movement. Denies bleeding or leakage of fluid.

## 2012-03-11 LAB — OB RESULTS CONSOLE GBS: GBS: NEGATIVE

## 2012-03-17 ENCOUNTER — Inpatient Hospital Stay (HOSPITAL_COMMUNITY)
Admission: AD | Admit: 2012-03-17 | Discharge: 2012-03-17 | Disposition: A | Discharge status: Home / Self Care | Payer: Medicaid Other | Source: Ambulatory Visit | Attending: Obstetrics & Gynecology | Admitting: Obstetrics & Gynecology

## 2012-03-17 ENCOUNTER — Encounter (HOSPITAL_COMMUNITY): Payer: Self-pay | Admitting: *Deleted

## 2012-03-17 DIAGNOSIS — O99891 Other specified diseases and conditions complicating pregnancy: Secondary | ICD-10-CM | POA: Insufficient documentation

## 2012-03-17 DIAGNOSIS — O479 False labor, unspecified: Secondary | ICD-10-CM

## 2012-03-17 DIAGNOSIS — Z3490 Encounter for supervision of normal pregnancy, unspecified, unspecified trimester: Secondary | ICD-10-CM

## 2012-03-17 DIAGNOSIS — R109 Unspecified abdominal pain: Secondary | ICD-10-CM | POA: Insufficient documentation

## 2012-03-17 NOTE — MAU Provider Note (Signed)
  History     CSN: [id number]  Arrival date and time: 03/17/12 1515   None     Chief Complaint  Patient presents with  . Labor Eval   HPI Patient is a 21 yo G2P1001 at 37.0 EGA who presents with complaint of stomach tightening.  States this started around 11 am this morning.  They occurred every 10-15 minutes.  States she called her OB and was told to lie down for an hour from 12:30-1:30 pm.  She did not have any contractions during that time.  Once she got up she started having the tightness in her abdomen again.  She states it stopped around 3 pm on her way to the MAU and she hasn't felt any tightness while in the MAU.  She denies any loss of fluid or bleeding.  States her baby is moving normally. Denies any other complaints.  States at her last cervical check she was 2 cm.  OB History    Grav Para Term Preterm Abortions TAB SAB Ect Mult Living   2 1 1  0 0 0 0 0 0 1     Sees [facility] for prenatal care.  Has gestational DM treated with diet.  Past Medical History  Diagnosis Date  . Hypoglycemia   . Bronchitis     Past Surgical History  Procedure Date  . Wisdom tooth extraction   . Hymen     Family History  Problem Relation Age of Onset  . Anesthesia problems Neg Hx   . Hypertension Other   . Hyperlipidemia Other   . Hypertension Father   . Diabetes Maternal Grandmother   . Liver disease Maternal Grandfather   . Hypertension Paternal Grandmother     History  Substance Use Topics  . Smoking status: Former Smoker    Quit date: 08/08/2011  . Smokeless tobacco: Never Used  . Alcohol Use: No    Allergies:  Allergies  Allergen Reactions  . Septra (Bactrim) Rash    Prescriptions prior to admission  Medication Sig Dispense Refill  . iron polysaccharides (NIFEREX 60) 40-20 MG capsule Take 1 capsule by mouth daily with breakfast.      . Prenatal Vit-Fe Fumarate-FA (PRENATAL MULTIVITAMIN) TABS Take 1 tablet by mouth daily.        Marland Kitchen albuterol (PROVENTIL  HFA;VENTOLIN HFA) 108 (90 BASE) MCG/ACT inhaler Inhale 2 puffs into the lungs every 4 (four) hours as needed for wheezing or shortness of breath (cough).  1 Inhaler  0    ROS negative except per HPI Physical Exam   Blood pressure 90/63, pulse 143, temperature 98.6 F (37 C), temperature source Oral, resp. rate 20, height 5' 2.5" (1.588 m), weight 64.411 kg (142 lb).  Physical Exam  Constitutional: She appears well-developed and well-nourished.  HENT:  Head: Normocephalic and atraumatic.  Cardiovascular: Normal rate, regular rhythm and normal heart sounds.   Respiratory: Effort normal and breath sounds normal.  GI: Soft. Bowel sounds are normal. There is no tenderness.       Gravid abdomen  Genitourinary:       Cervix: 1/thick/high   FHR: 160, moderate, accels present, decels absent, one contraction seen while in the MAU MAU Course  Procedures  Assessment and Plan  Patient is a G2P1001 at 37.0 EGA presenting for abdominal tightness.  Labor evaluation: no cervical change, contractions stopped. Patient not in labor. Discharged home with labor precautions. To follow-up with [facility] as scheduled.  Marikay Alar 03/17/2012, 4:16 PM

## 2012-03-17 NOTE — MAU Note (Signed)
Pt states. " i've had contractions since 1130. I layed down 1230-1330 and they stopped, but as soon as I stood they started back."

## 2012-03-17 NOTE — MAU Provider Note (Signed)
Patient seen and examined.  Agree with above note.  Levie Heritage, DO 03/17/2012 6:32 PM

## 2012-03-30 ENCOUNTER — Encounter (HOSPITAL_COMMUNITY): Payer: Self-pay | Admitting: *Deleted

## 2012-03-30 ENCOUNTER — Inpatient Hospital Stay (HOSPITAL_COMMUNITY)
Admission: AD | Admit: 2012-03-30 | Discharge: 2012-03-31 | DRG: 372 | Disposition: A | Discharge status: Home / Self Care | Payer: BC Managed Care – PPO | Source: Ambulatory Visit | Attending: Obstetrics and Gynecology | Admitting: Obstetrics and Gynecology

## 2012-03-30 DIAGNOSIS — O99814 Abnormal glucose complicating childbirth: Secondary | ICD-10-CM

## 2012-03-30 LAB — CBC
HCT: 37.2 % (ref 36.0–46.0)
Hemoglobin: 13 g/dL (ref 12.0–15.0)
MCH: 33.5 pg (ref 26.0–34.0)
MCHC: 34.9 g/dL (ref 30.0–36.0)
MCV: 95.9 fL (ref 78.0–100.0)
Platelets: 132 10*3/uL — ABNORMAL LOW (ref 150–400)
RBC: 3.88 MIL/uL (ref 3.87–5.11)
RDW: 14 % (ref 11.5–15.5)
WBC: 10.2 10*3/uL (ref 4.0–10.5)

## 2012-03-30 LAB — RPR: RPR Ser Ql: NONREACTIVE

## 2012-03-30 LAB — TYPE AND SCREEN
ABO/RH(D): A POS
Antibody Screen: NEGATIVE

## 2012-03-30 MED ORDER — OXYCODONE-ACETAMINOPHEN 5-325 MG PO TABS: 1.0000 | ORAL_TABLET | ORAL | Status: DC | PRN

## 2012-03-30 MED ORDER — ONDANSETRON HCL 4 MG PO TABS: 4.0000 mg | ORAL_TABLET | ORAL | Status: DC | PRN

## 2012-03-30 MED ORDER — OXYTOCIN 40 UNITS IN LACTATED RINGERS INFUSION - SIMPLE MED
62.5000 mL/h | Freq: Once | INTRAVENOUS | Status: AC
  Administered 2012-03-30: 62.5 mL/h via INTRAVENOUS
  Filled 2012-03-30: qty 1000

## 2012-03-30 MED ORDER — DIPHENHYDRAMINE HCL 25 MG PO CAPS: 25.0000 mg | ORAL_CAPSULE | Freq: Four times a day (QID) | ORAL | Status: DC | PRN

## 2012-03-30 MED ORDER — TETANUS-DIPHTH-ACELL PERTUSSIS 5-2.5-18.5 LF-MCG/0.5 IM SUSP
0.5000 mL | Freq: Once | INTRAMUSCULAR | Status: AC
  Administered 2012-03-31: 0.5 mL via INTRAMUSCULAR
  Filled 2012-03-30: qty 0.5

## 2012-03-30 MED ORDER — LIDOCAINE HCL (PF) 1 % IJ SOLN
30.0000 mL | INTRAMUSCULAR | Status: DC | PRN
  Filled 2012-03-30: qty 30

## 2012-03-30 MED ORDER — OXYTOCIN BOLUS FROM INFUSION
250.0000 mL | Freq: Once | INTRAVENOUS | Status: DC
  Filled 2012-03-30: qty 500

## 2012-03-30 MED ORDER — LACTATED RINGERS IV SOLN
INTRAVENOUS | Status: DC
  Administered 2012-03-30: 02:00:00 via INTRAVENOUS

## 2012-03-30 MED ORDER — SIMETHICONE 80 MG PO CHEW: 80.0000 mg | CHEWABLE_TABLET | ORAL | Status: DC | PRN

## 2012-03-30 MED ORDER — WITCH HAZEL-GLYCERIN EX PADS: 1.0000 "application " | MEDICATED_PAD | CUTANEOUS | Status: DC | PRN

## 2012-03-30 MED ORDER — FLEET ENEMA 7-19 GM/118ML RE ENEM: 1.0000 | ENEMA | RECTAL | Status: DC | PRN

## 2012-03-30 MED ORDER — IBUPROFEN 600 MG PO TABS
600.0000 mg | ORAL_TABLET | Freq: Four times a day (QID) | ORAL | Status: DC
  Administered 2012-03-30 – 2012-03-31 (×6): 600 mg via ORAL
  Filled 2012-03-30 (×6): qty 1

## 2012-03-30 MED ORDER — CITRIC ACID-SODIUM CITRATE 334-500 MG/5ML PO SOLN: 30.0000 mL | ORAL | Status: DC | PRN

## 2012-03-30 MED ORDER — ALBUTEROL SULFATE HFA 108 (90 BASE) MCG/ACT IN AERS
2.0000 | INHALATION_SPRAY | RESPIRATORY_TRACT | Status: DC | PRN
  Filled 2012-03-30: qty 6.7

## 2012-03-30 MED ORDER — ACETAMINOPHEN 325 MG PO TABS: 650.0000 mg | ORAL_TABLET | ORAL | Status: DC | PRN

## 2012-03-30 MED ORDER — NALBUPHINE SYRINGE 5 MG/0.5 ML: 10.0000 mg | INJECTION | INTRAMUSCULAR | Status: DC | PRN

## 2012-03-30 MED ORDER — PRENATAL MULTIVITAMIN CH
1.0000 | ORAL_TABLET | Freq: Every day | ORAL | Status: DC
  Administered 2012-03-30 – 2012-03-31 (×2): 1 via ORAL
  Filled 2012-03-30 (×2): qty 1

## 2012-03-30 MED ORDER — LACTATED RINGERS IV SOLN: 500.0000 mL | INTRAVENOUS | Status: DC | PRN

## 2012-03-30 MED ORDER — SENNOSIDES-DOCUSATE SODIUM 8.6-50 MG PO TABS
2.0000 | ORAL_TABLET | Freq: Every day | ORAL | Status: DC
  Administered 2012-03-30: 2 via ORAL

## 2012-03-30 MED ORDER — ONDANSETRON HCL 4 MG/2ML IJ SOLN: 4.0000 mg | Freq: Four times a day (QID) | INTRAMUSCULAR | Status: DC | PRN

## 2012-03-30 MED ORDER — ZOLPIDEM TARTRATE 5 MG PO TABS: 5.0000 mg | ORAL_TABLET | Freq: Every evening | ORAL | Status: DC | PRN

## 2012-03-30 MED ORDER — BENZOCAINE-MENTHOL 20-0.5 % EX AERO
1.0000 "application " | INHALATION_SPRAY | CUTANEOUS | Status: DC | PRN
  Filled 2012-03-30: qty 56

## 2012-03-30 MED ORDER — DIBUCAINE 1 % RE OINT
1.0000 "application " | TOPICAL_OINTMENT | RECTAL | Status: DC | PRN
  Filled 2012-03-30: qty 28

## 2012-03-30 MED ORDER — ONDANSETRON HCL 4 MG/2ML IJ SOLN: 4.0000 mg | INTRAMUSCULAR | Status: DC | PRN

## 2012-03-30 MED ORDER — LANOLIN HYDROUS EX OINT: TOPICAL_OINTMENT | CUTANEOUS | Status: DC | PRN

## 2012-03-30 MED ORDER — IBUPROFEN 600 MG PO TABS: 600.0000 mg | ORAL_TABLET | Freq: Four times a day (QID) | ORAL | Status: DC | PRN

## 2012-03-30 NOTE — H&P (Signed)
Summer Spencer is a 21 y.o. female presenting for regular ctx. Denies bldg. Possibly leaking amniotic fluid just prior to exam. Her preg has been followed by the [facility] service and has been remarkable for 1) GDM diet controlled with range 90-200 2) first infant 9+11. Maternal Medical History:  Reason for admission: Reason for admission: contractions.  Contractions: Frequency: regular.   Perceived severity is strong.    Fetal activity: Perceived fetal activity is normal.      OB History    Grav Para Term Preterm Abortions TAB SAB Ect Mult Living   2 1 1  0 0 0 0 0 0 1     Past Medical History  Diagnosis Date  . Hypoglycemia   . Bronchitis    Past Surgical History  Procedure Date  . Wisdom tooth extraction   . Hymen    Family History: family history includes Diabetes in her maternal grandmother; Hyperlipidemia in her other; Hypertension in her father, other, and paternal grandmother; and Liver disease in her maternal grandfather.  There is no history of Anesthesia problems. Social History:  reports that she quit smoking about 7 months ago. She has never used smokeless tobacco. She reports that she does not drink alcohol or use illicit drugs.   Prenatal Transfer Tool  Maternal Diabetes: Yes:  Diabetes Type:  Diet controlled Genetic Screening: Normal Maternal Ultrasounds/Referrals: Normal Fetal Ultrasounds or other Referrals:  None Maternal Substance Abuse:  No Significant Maternal Medications:  None Significant Maternal Lab Results:  None Other Comments:  None  ROS  Dilation: 7 Effacement (%): 90 Station: -1 Exam by:: melanie snm There were no vitals taken for this visit. Maternal Exam:  Uterine Assessment: Contraction strength is firm.  Contraction duration is 60 seconds. Contraction frequency is regular.   Abdomen: Patient reports no abdominal tenderness. Fetal presentation: vertex  Introitus: Normal vulva. Normal vagina.  Cervix: Cervix evaluated by digital  exam.     Fetal Exam Fetal Monitor Review: Mode: ultrasound.   Baseline rate: 150.  Variability: moderate (6-25 bpm).   Pattern: accelerations present.    Fetal State Assessment: Category II - tracings are indeterminate.     Physical Exam  Constitutional: She is oriented to person, place, and time. She appears well-developed.  HENT:  Head: Normocephalic.  Cardiovascular: Normal rate.   Respiratory: Effort normal.  GI: Soft.  Genitourinary: Vagina normal.  Musculoskeletal: Normal range of motion.  Neurological: She is alert and oriented to person, place, and time.  Skin: Skin is warm and dry.  Psychiatric: She has a normal mood and affect.    Prenatal labs: ABO, Rh: --/--/A POS (12/30 1930)  Antibody:neg   Rubella:  iimmunt RPR: NR   HBsAg:   neg HIV:   neg GBS:  neg   Assessment/Plan: IUP @ [redacted]w[redacted]d Active labor GDM-diet controlled  Admit, fetal monitoring per protocol, anticipate SVD and possible LGA.   Lawernce Pitts 03/30/2012, 1:59 AM

## 2012-03-30 NOTE — Progress Notes (Signed)
UR Chart review completed.  

## 2012-03-30 NOTE — MAU Note (Signed)
Patient in from the lobby to rm 6, she is in for labor eval. States that contractions are intense with urge to push. Denies lof. Reports good fetal movement. Shawna Orleans, Firefighter at bedside.

## 2012-03-31 ENCOUNTER — Encounter (HOSPITAL_COMMUNITY): Payer: Self-pay | Admitting: *Deleted

## 2012-03-31 MED ORDER — IBUPROFEN 600 MG PO TABS: 600.0000 mg | ORAL_TABLET | Freq: Four times a day (QID) | ORAL | Status: AC

## 2012-03-31 NOTE — Discharge Summary (Addendum)
Obstetric Discharge Summary Reason for Admission: onset of labor Prenatal Procedures: none Intrapartum Procedures: spontaneous vaginal delivery w/shoulder dystocia Postpartum Procedures: none Complications-Operative and Postpartum: First degree perineal laceration Hemoglobin  Date Value Range Status  03/30/2012 13.0  12.0 - 15.0 g/dL Final     HCT  Date Value Range Status  03/30/2012 37.2  36.0 - 46.0 % Final    Physical Exam:  General: alert and cooperative Lochia: appropriate Uterine Fundus: firm DVT Evaluation: No evidence of DVT seen on physical exam.  Discharge Diagnoses: Term Pregnancy-delivered  Discharge Information: Date: 03/31/2012 Activity: pelvic rest Diet: Routine Medications: PNV and Ibuprofen Condition: stable Instructions: refer to practice specific booklet Discharge to: home Follow-up Information    [facility] with [facility] @.      Follow up with [facility] @ in 6 weeks.         Newborn Data: Live born female  Birth Weight: 9 lb 14.4 oz (4490 g) APGAR: 3, 8  Home with mother.  Summer Spencer 03/31/2012, 7:39 AM  I have seen and examined this patient and agree the above assessment. CRESENZO-DISHMAN,Shivon Hackel 04/05/2012 7:01 PM

## 2012-03-31 NOTE — Discharge Summary (Signed)
I have seen and examined this patient and agree the above assessment. CRESENZO-DISHMAN,Irie Dowson 03/31/2012 7:48 AM

## 2012-04-02 NOTE — H&P (Signed)
Attestation of Attending Supervision of Advanced Practitioner: Evaluation and management procedures were performed by the PA/NP/CNM/OB Fellow under my supervision/collaboration. Chart reviewed and agree with management and plan.  Mylah Baynes V 04/02/2012 6:57 AM

## 2012-04-13 NOTE — Discharge Summary (Signed)
Attestation of Attending Supervision of Advanced Practitioner (CNM/NP): Evaluation and management procedures were performed by the Advanced Practitioner under my supervision and collaboration.  I have reviewed the Advanced Practitioner's note and chart, and I agree with the management and plan.  UGONNA  ANYANWU, MD, FACOG Attending Obstetrician & Gynecologist Faculty Practice, [facility], [place]  

## 2012-10-01 ENCOUNTER — Encounter (HOSPITAL_BASED_OUTPATIENT_CLINIC_OR_DEPARTMENT_OTHER): Payer: Self-pay | Admitting: Emergency Medicine

## 2012-10-01 ENCOUNTER — Emergency Department (HOSPITAL_BASED_OUTPATIENT_CLINIC_OR_DEPARTMENT_OTHER)
Admission: EM | Admit: 2012-10-01 | Discharge: 2012-10-01 | Disposition: A | Discharge status: Home / Self Care | Payer: BC Managed Care – PPO | Attending: Emergency Medicine | Admitting: Emergency Medicine

## 2012-10-01 DIAGNOSIS — J3489 Other specified disorders of nose and nasal sinuses: Secondary | ICD-10-CM | POA: Insufficient documentation

## 2012-10-01 DIAGNOSIS — R5381 Other malaise: Secondary | ICD-10-CM | POA: Insufficient documentation

## 2012-10-01 DIAGNOSIS — R6889 Other general symptoms and signs: Secondary | ICD-10-CM

## 2012-10-01 DIAGNOSIS — Z8619 Personal history of other infectious and parasitic diseases: Secondary | ICD-10-CM | POA: Insufficient documentation

## 2012-10-01 DIAGNOSIS — R51 Headache: Secondary | ICD-10-CM | POA: Insufficient documentation

## 2012-10-01 DIAGNOSIS — R509 Fever, unspecified: Secondary | ICD-10-CM | POA: Insufficient documentation

## 2012-10-01 DIAGNOSIS — Z8639 Personal history of other endocrine, nutritional and metabolic disease: Secondary | ICD-10-CM | POA: Insufficient documentation

## 2012-10-01 DIAGNOSIS — Z87891 Personal history of nicotine dependence: Secondary | ICD-10-CM | POA: Insufficient documentation

## 2012-10-01 DIAGNOSIS — R5383 Other fatigue: Secondary | ICD-10-CM | POA: Insufficient documentation

## 2012-10-01 DIAGNOSIS — R059 Cough, unspecified: Secondary | ICD-10-CM | POA: Insufficient documentation

## 2012-10-01 DIAGNOSIS — IMO0001 Reserved for inherently not codable concepts without codable children: Secondary | ICD-10-CM | POA: Insufficient documentation

## 2012-10-01 DIAGNOSIS — J029 Acute pharyngitis, unspecified: Secondary | ICD-10-CM | POA: Insufficient documentation

## 2012-10-01 DIAGNOSIS — Z862 Personal history of diseases of the blood and blood-forming organs and certain disorders involving the immune mechanism: Secondary | ICD-10-CM | POA: Insufficient documentation

## 2012-10-01 DIAGNOSIS — R05 Cough: Secondary | ICD-10-CM | POA: Insufficient documentation

## 2012-10-01 DIAGNOSIS — R110 Nausea: Secondary | ICD-10-CM | POA: Insufficient documentation

## 2012-10-01 DIAGNOSIS — Z8709 Personal history of other diseases of the respiratory system: Secondary | ICD-10-CM | POA: Insufficient documentation

## 2012-10-01 NOTE — ED Provider Notes (Signed)
History     CSN: [id number]  Arrival date & time 10/01/12  1034   First MD Initiated Contact with Patient 10/01/12 1055      Chief Complaint  Patient presents with  . Sore Throat  . Cough  . Headache    HPI Patient is a 22 yo previously healthy female presenting with headache, body aches, sore throat, cough and nasal congestion since last night. She denies fever, but states she has felt hot and cold. She states her parents were diagnosed with the flu last week. She did not have a flu shot this year (and neither did her 2 young children.) She has tried OTC mucinex. She has been able to eat and drink with no nausea.  Past Medical History  Diagnosis Date  . Hypoglycemia   . Bronchitis     Past Surgical History  Procedure Date  . Wisdom tooth extraction   . Hymen     Family History  Problem Relation Age of Onset  . Anesthesia problems Neg Hx   . Hypertension Other   . Hyperlipidemia Other   . Hypertension Father   . Diabetes Maternal Grandmother   . Liver disease Maternal Grandfather   . Hypertension Paternal Grandmother     History  Substance Use Topics  . Smoking status: Former Smoker    Quit date: 08/08/2011  . Smokeless tobacco: Never Used  . Alcohol Use: No    OB History    Grav Para Term Preterm Abortions TAB SAB Ect Mult Living   2 2 2  0 0 0 0 0 0 2      Review of Systems  Constitutional: Positive for fatigue. Negative for fever.  HENT: Positive for congestion, sore throat and rhinorrhea. Negative for trouble swallowing and neck pain.   Respiratory: Positive for cough. Negative for shortness of breath.   Cardiovascular: Negative for chest pain.  Gastrointestinal: Negative for nausea and vomiting.  Genitourinary: Negative for dysuria.  Musculoskeletal: Positive for myalgias and arthralgias.  Skin: Negative for rash.  Neurological: Positive for headaches. Negative for dizziness.  All other systems reviewed and are negative.    Allergies   Septra  Home Medications  No current outpatient prescriptions on file.  BP 113/60  Pulse 98  Temp 98.4 F (36.9 C) (Oral)  Resp 18  Ht 5\' 2"  (1.575 m)  Wt 120 lb (54.432 kg)  BMI 21.95 kg/m2  SpO2 100%  LMP 09/11/2012  Physical Exam  Constitutional: She is oriented to person, place, and time. She appears well-developed and well-nourished. She has a sickly appearance.  HENT:  Head: Normocephalic and atraumatic.  Right Ear: External ear normal.  Left Ear: External ear normal.  Mouth/Throat: Mucous membranes are normal. Posterior oropharyngeal edema and posterior oropharyngeal erythema present. No oropharyngeal exudate.  Cardiovascular: Normal rate and regular rhythm.   Pulmonary/Chest: Effort normal and breath sounds normal. She has no wheezes.  Abdominal: Soft. There is no tenderness.  Musculoskeletal: Normal range of motion. She exhibits no edema and no tenderness.  Neurological: She is alert and oriented to person, place, and time. No cranial nerve deficit.  Skin: Skin is warm and dry. No rash noted.    ED Course  Procedures (including critical care time)  Labs Reviewed - No data to display No results found.   1. Flu-like symptoms     MDM  22 yo previously healthy female with flu-like illness  She has known sick contacts. Not tested for flu today. Discussed the risks and benefits  of Tamiflu, including the price and benefit of slightly reducing the length of illness. Tamiflu not given, but she should continue all symptomatic treatments including Tylenol, rest, fluids and OTC cold medication. She should practice good hand hygiene.     Hilarie Fredrickson, MD 10/01/12 1128

## 2012-10-01 NOTE — ED Provider Notes (Signed)
I  reviewed the resident's note and I agree with the findings and plan.      Nelia Shi, MD 10/01/12 [phone]

## 2012-10-01 NOTE — ED Notes (Signed)
Pt has sore throat, fever, cough, body aches since last night.  Has been around parents who had flu.  Some nausea, no V/D.  Some chills.

## 2012-12-21 IMAGING — CT CT HEAD W/O CM
2 series · 16 of 30 positions shown, 20 images · non-contrast
Comparison: None

CLINICAL DATA: Right side headache, pregnant

CT HEAD WITHOUT CONTRAST
TECHNIQUE: Contiguous axial images were obtained from the base of
the skull through the vertex without contrast.

[Series 2: routine head w/o · axial · non-contrast · 0.40mm/px · z∈[+1129,+1253]mm · 13 of 32 slices shown, 17 images]
[im 3/32  brain]
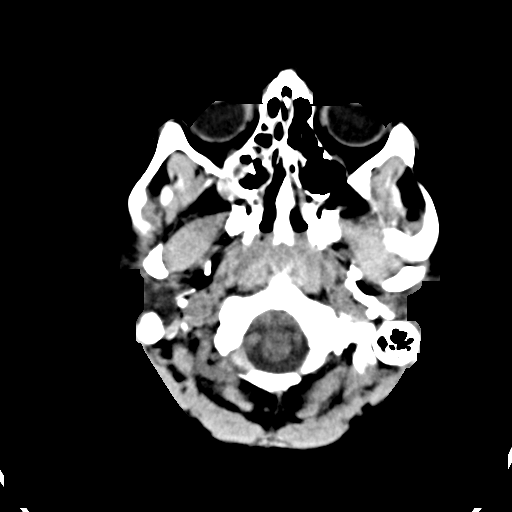
[im 3/32  bone]
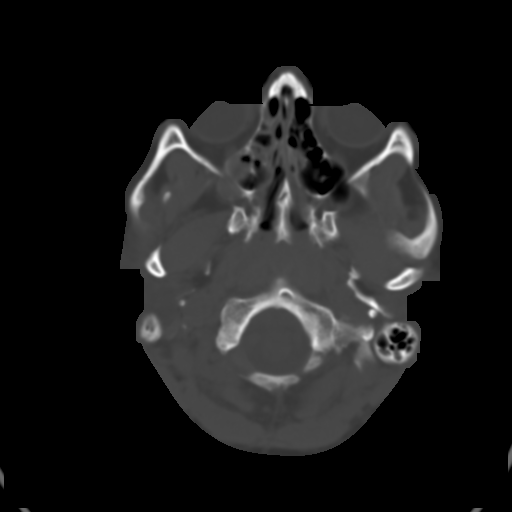
[im 5/32  brain]
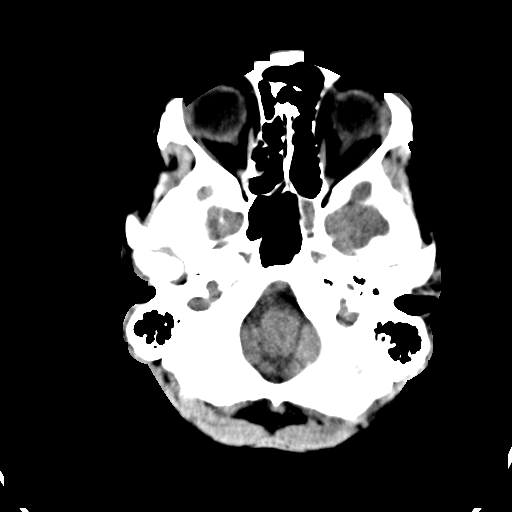
[im 7/32  brain]
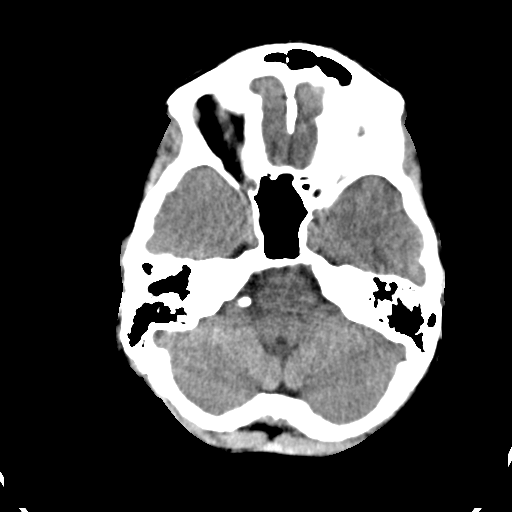
[im 9/32  brain]
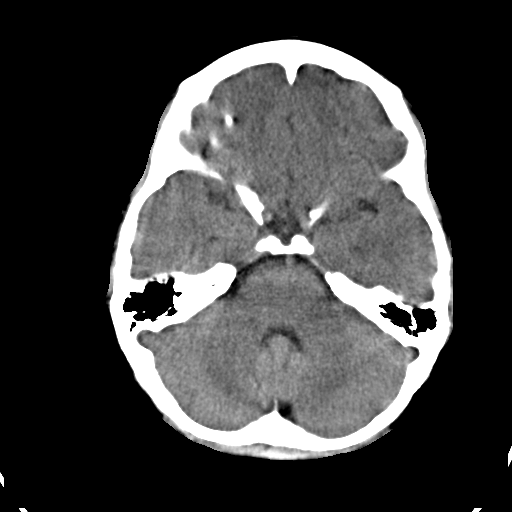
[im 12/32  brain]
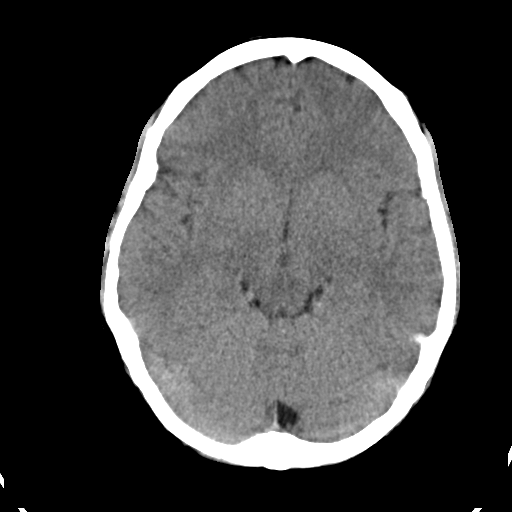
[im 12/32  bone]
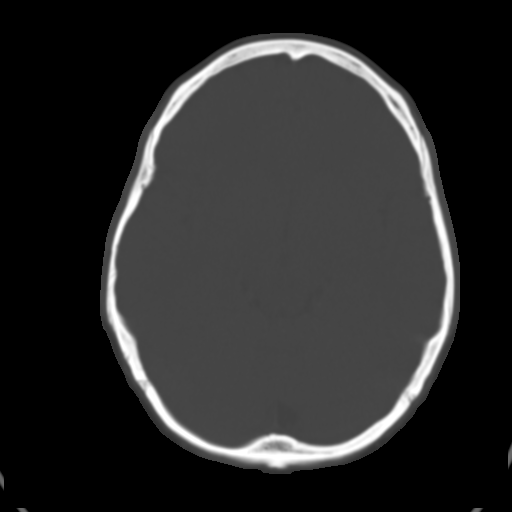
[im 14/32  brain]
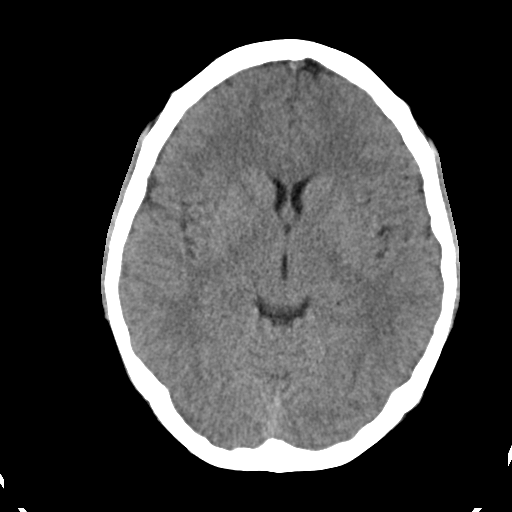
[im 16/32  brain]
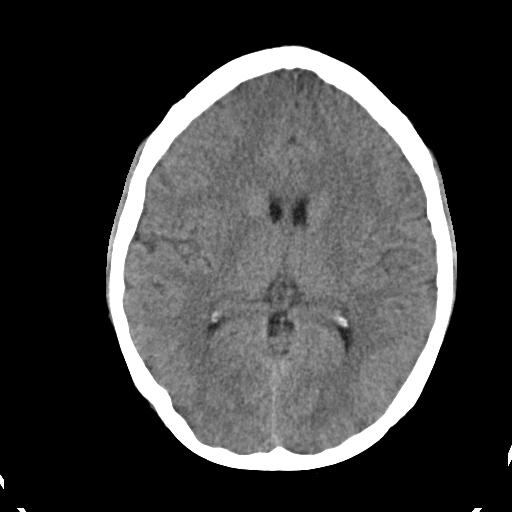
[im 18/32  brain]
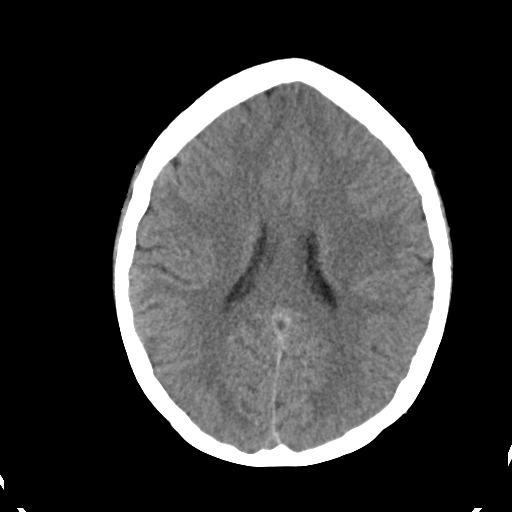
[im 20/32  brain]
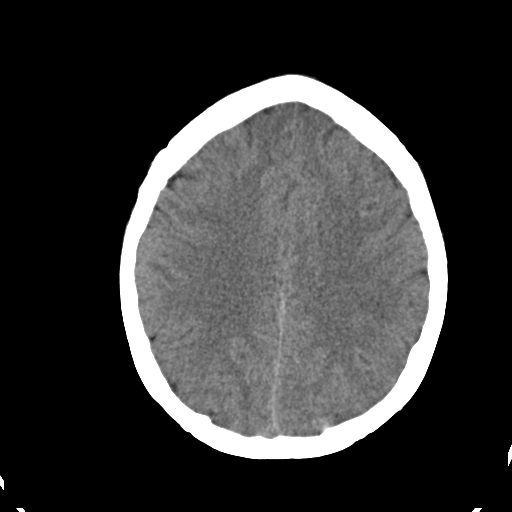
[im 20/32  bone]
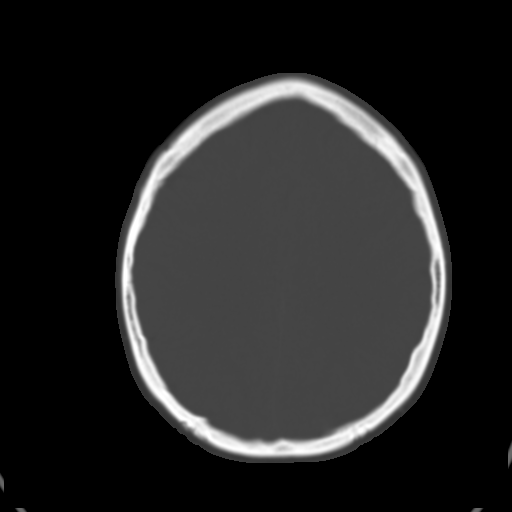
[im 23/32  brain]
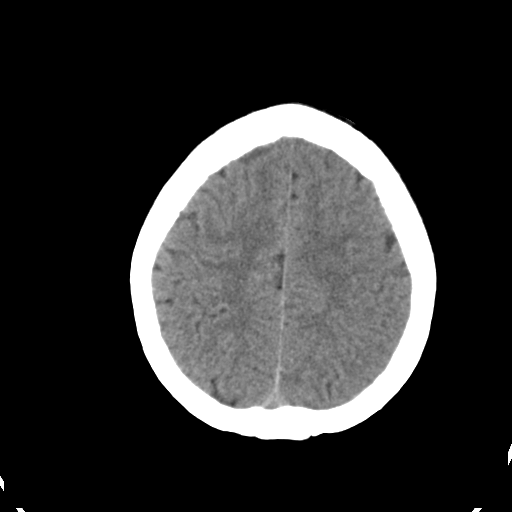
[im 25/32  brain]
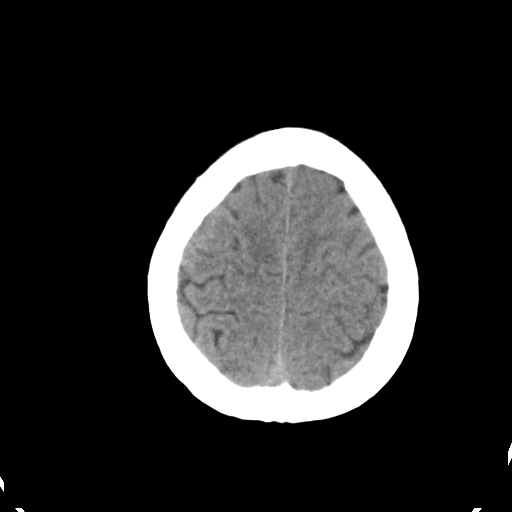
[im 27/32  brain]
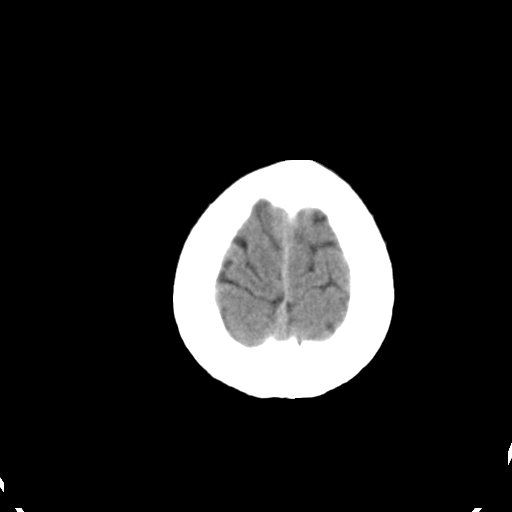
[im 29/32  brain]
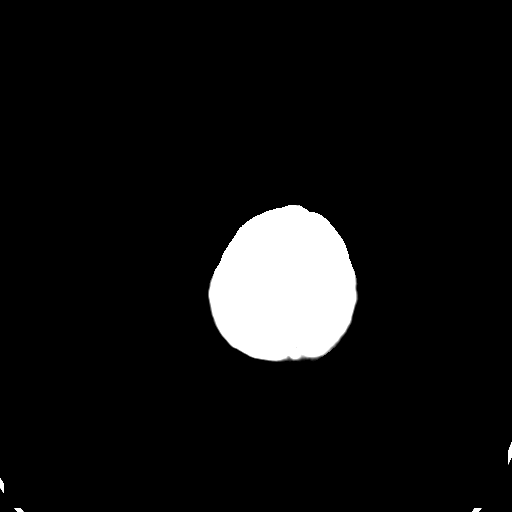
[im 29/32  bone]
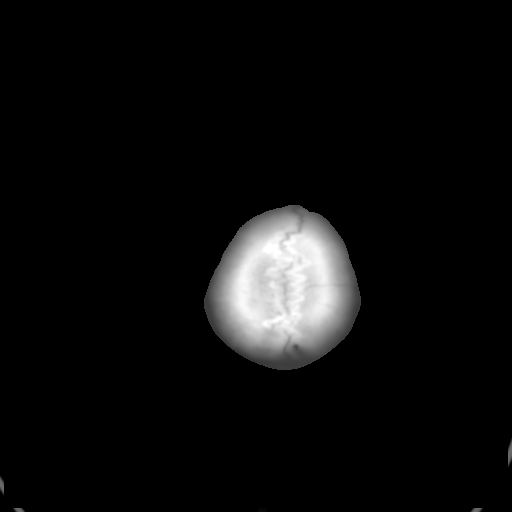

[Series 3: routine head bone · axial · 0.40mm/px · z∈[+1129,+1172]mm · 3 of 32 slices shown]
[im 3/32  bone]
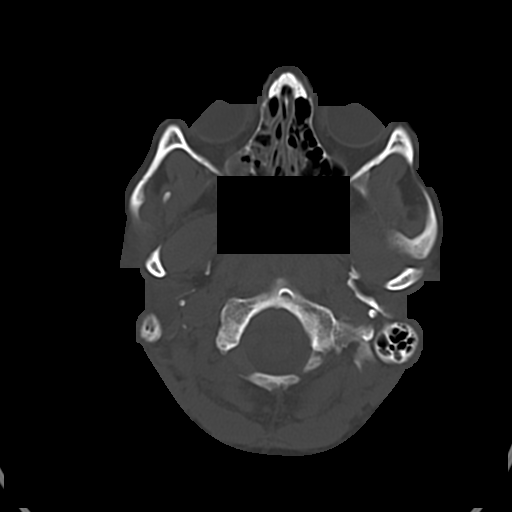
[im 7/32  bone]
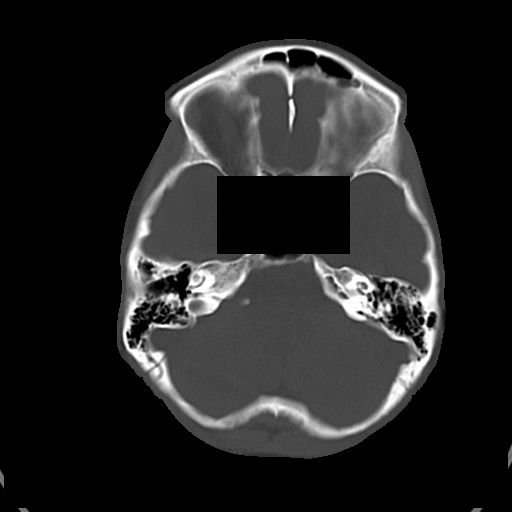
[im 12/32  bone]
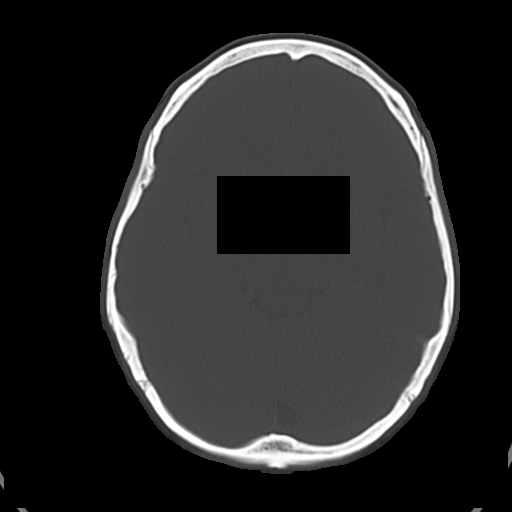

[16 of 30 positions shown; findings below may reference images not displayed]

FINDINGS: Abdomen shielded.
Normal ventricular morphology.
No midline shift or mass effect.
Normal appearance of brain parenchyma.
No intracranial hemorrhage, mass lesion, or evidence of acute
infarction.
No extra-axial fluid collections.
Osseous structures unremarkable.
Partial opacification of ethmoid air cells and sphenoid sinus with
sub total opacification of the right maxillary sinus apex question
sinusitis.
IMPRESSION: No acute intracranial abnormalities.
Question right maxillary sinusitis.

## 2013-03-31 ENCOUNTER — Encounter (HOSPITAL_BASED_OUTPATIENT_CLINIC_OR_DEPARTMENT_OTHER): Payer: Self-pay | Admitting: *Deleted

## 2013-03-31 ENCOUNTER — Emergency Department (HOSPITAL_BASED_OUTPATIENT_CLINIC_OR_DEPARTMENT_OTHER)
Admission: EM | Admit: 2013-03-31 | Discharge: 2013-03-31 | Disposition: A | Discharge status: Home / Self Care | Payer: BC Managed Care – PPO | Attending: Emergency Medicine | Admitting: Emergency Medicine

## 2013-03-31 DIAGNOSIS — Z8709 Personal history of other diseases of the respiratory system: Secondary | ICD-10-CM | POA: Insufficient documentation

## 2013-03-31 DIAGNOSIS — L259 Unspecified contact dermatitis, unspecified cause: Secondary | ICD-10-CM | POA: Insufficient documentation

## 2013-03-31 DIAGNOSIS — L309 Dermatitis, unspecified: Secondary | ICD-10-CM

## 2013-03-31 DIAGNOSIS — Z8639 Personal history of other endocrine, nutritional and metabolic disease: Secondary | ICD-10-CM | POA: Insufficient documentation

## 2013-03-31 DIAGNOSIS — Z87891 Personal history of nicotine dependence: Secondary | ICD-10-CM | POA: Insufficient documentation

## 2013-03-31 DIAGNOSIS — Z862 Personal history of diseases of the blood and blood-forming organs and certain disorders involving the immune mechanism: Secondary | ICD-10-CM | POA: Insufficient documentation

## 2013-03-31 MED ORDER — HYDROCORTISONE 1 % EX CREA: TOPICAL_CREAM | CUTANEOUS | Status: DC

## 2013-03-31 NOTE — ED Notes (Signed)
Pt left wallet in pt room contacted via phone to come pick up lost item. Pt enroute to [facility]

## 2013-03-31 NOTE — ED Notes (Signed)
Rash to trunk and arms x 3 days

## 2013-03-31 NOTE — ED Provider Notes (Signed)
CSN: [id number]     Arrival date & time 03/31/13  2205 History  This chart was scribed for Summer Spencer Smitty Cords, MD by Manuela Schwartz, ED scribe. This patient was seen in room MH04/MH04 and the patient's care was started at 2301.   First MD Initiated Contact with Patient 03/31/13 2301     Chief Complaint  Patient presents with  . Rash   Patient is a 22 y.o. female presenting with rash. The history is provided by the patient. No language interpreter was used.  Rash Pain location:  Generalized Pain radiates to:  Does not radiate Pain severity:  No pain Duration:  3 days Timing:  Constant Progression:  Unchanged Chronicity:  New Context: not recent illness   Relieved by:  Nothing Worsened by:  Nothing tried Ineffective treatments:  Acetaminophen Associated symptoms: no chills, no fever, no nausea, no shortness of breath and no vomiting    HPI Comments: Summer Spencer is a 22 y.o. female who presents to the Emergency Department complaining of constant, gradual onset, rash over her abdomen and back for 3 days. She states took benadryl today w/out relief from her rash. She denies any new soap, detergents, perfumes, medicines, etc. She denies any fever/ chills or recent travel.   Past Medical History  Diagnosis Date  . Hypoglycemia   . Bronchitis    Past Surgical History  Procedure Laterality Date  . Wisdom tooth extraction    . Hymen     Family History  Problem Relation Age of Onset  . Anesthesia problems Neg Hx   . Hypertension Other   . Hyperlipidemia Other   . Hypertension Father   . Diabetes Maternal Grandmother   . Liver disease Maternal Grandfather   . Hypertension Paternal Grandmother    History  Substance Use Topics  . Smoking status: Former Smoker    Quit date: 08/08/2011  . Smokeless tobacco: Never Used  . Alcohol Use: 0.5 oz/week    1 drink(s) per week   OB History   Grav Para Term Preterm Abortions TAB SAB Ect Mult Living   2 2 2  0 0 0 0 0 0 2      Review of Systems  Constitutional: Negative for fever and chills.  Respiratory: Negative for shortness of breath.   Gastrointestinal: Negative for nausea and vomiting.  Skin: Positive for rash (red rash over her trunk and arms).  Neurological: Negative for weakness.  All other systems reviewed and are negative.   A complete 10 system review of systems was obtained and all systems are negative except as noted in the HPI and PMH.   Allergies  Septra  Home Medications  No current outpatient prescriptions on file. Triage Vitals: BP 118/62  Pulse 77  Temp(Src) 98 F (36.7 C) (Oral)  Resp 20  Ht 5\' 2"  (1.575 m)  Wt 122 lb (55.339 kg)  BMI 22.31 kg/m2  SpO2 100%  LMP 03/14/2013  Breastfeeding? No Physical Exam  Nursing note and vitals reviewed. Constitutional: She is oriented to person, place, and time. She appears well-developed and well-nourished. No distress.  HENT:  Head: Normocephalic and atraumatic.  Mouth/Throat: Oropharynx is clear and moist.  Eyes: EOM are normal. Pupils are equal, round, and reactive to light.  Neck: Neck supple. No tracheal deviation present.  Cardiovascular: Normal rate, regular rhythm and normal heart sounds.   No murmur heard. Pulmonary/Chest: Effort normal and breath sounds normal. No respiratory distress. She has no wheezes. She has no rales.  Abdominal: Soft.  Bowel sounds are normal. She exhibits no distension. There is no tenderness. There is no rebound and no guarding.  Musculoskeletal: Normal range of motion.  Neurological: She is alert and oriented to person, place, and time.  Skin: Skin is warm and dry. Rash noted.  Fine papular eruption rash, no erythema over her waistline.    Psychiatric: She has a normal mood and affect. Her behavior is normal.    ED Course   Procedures (including critical care time) DIAGNOSTIC STUDIES: Oxygen Saturation is 100% on room air, normal by my interpretation.    COORDINATION OF CARE: At 28 PM  Discussed treatment plan with patient which includes hydrocortisone. Patient agrees.   Labs Reviewed - No data to display No results found. No diagnosis found.  MDM  I personally performed the services described in this documentation, which was scribed in my presence. The recorded information has been reviewed and is accurate.     Marcio Hoque Smitty Cords, MD 04/01/13 0030

## 2013-11-19 ENCOUNTER — Encounter (INDEPENDENT_AMBULATORY_CARE_PROVIDER_SITE_OTHER): Payer: Self-pay

## 2013-11-19 ENCOUNTER — Ambulatory Visit (INDEPENDENT_AMBULATORY_CARE_PROVIDER_SITE_OTHER): Payer: BC Managed Care – PPO | Admitting: Family Medicine

## 2013-11-19 VITALS — BP 119/70 | HR 78 | Temp 99.0°F | Ht 62.0 in | Wt 126.0 lb

## 2013-11-19 DIAGNOSIS — J029 Acute pharyngitis, unspecified: Secondary | ICD-10-CM

## 2013-11-19 LAB — POCT RAPID STREP A (OFFICE): Rapid Strep A Screen: NEGATIVE

## 2013-11-19 MED ORDER — AZITHROMYCIN 250 MG PO TABS: ORAL_TABLET | ORAL | Status: DC

## 2013-11-19 MED ORDER — BENZONATATE 100 MG PO CAPS: 100.0000 mg | ORAL_CAPSULE | Freq: Three times a day (TID) | ORAL | Status: DC | PRN

## 2013-11-19 NOTE — Progress Notes (Signed)
   Subjective:    Patient ID: Currie ParisKayla M Dady, female    DOB: 06/22/1991, 23 y.o.   MRN: [id number]  HPI This 23 y.o. female presents for evaluation of sore throat and uri sx's for over a week.   Review of Systems No chest pain, SOB, HA, dizziness, vision change, N/V, diarrhea, constipation, dysuria, urinary urgency or frequency, myalgias, arthralgias or rash.     Objective:   Physical Exam Vital signs noted  Well developed well nourished female.  HEENT - Head atraumatic Normocephalic                Eyes - PERRLA, Conjuctiva - clear Sclera- Clear EOMI                Ears - EAC's Wnl TM's Wnl Gross Hearing WNL                Throat - oropharanx wnl Respiratory - Lungs CTA bilateral Cardiac - RRR S1 and S2 without murmur GI - Abdomen soft Nontender and bowel sounds active x 4    Results for orders placed in visit on 11/19/13  POCT RAPID STREP A (OFFICE)      Result Value Ref Range   Rapid Strep A Screen Negative  Negative      Assessment & Plan:  Sore throat - Plan: Rapid Strep A Zpak as directed Tessalon Perles Push po fluids, rest, tylenol and motrin otc prn as directed for fever, arthralgias, and myalgias.  Follow up prn if sx's continue or persist.  Deatra CanterWilliam J Mckinsley Koelzer FNP

## 2013-11-19 NOTE — Patient Instructions (Signed)

## 2015-12-13 ENCOUNTER — Encounter: Payer: Self-pay | Admitting: Family Medicine

## 2015-12-13 ENCOUNTER — Ambulatory Visit: Payer: Self-pay | Admitting: Family Medicine

## 2015-12-13 VITALS — BP 119/81 | HR 88 | Temp 97.2°F | Ht 62.0 in | Wt 109.8 lb

## 2015-12-13 DIAGNOSIS — A499 Bacterial infection, unspecified: Secondary | ICD-10-CM

## 2015-12-13 DIAGNOSIS — B9689 Other specified bacterial agents as the cause of diseases classified elsewhere: Secondary | ICD-10-CM

## 2015-12-13 DIAGNOSIS — N760 Acute vaginitis: Secondary | ICD-10-CM

## 2015-12-13 MED ORDER — METRONIDAZOLE 500 MG PO TABS: 500.0000 mg | ORAL_TABLET | Freq: Two times a day (BID) | ORAL | Status: DC

## 2015-12-13 NOTE — Progress Notes (Signed)
BP 119/81 mmHg  Pulse 88  Temp(Src) 97.2 F (36.2 C) (Oral)  Ht  (1.575 m)  Wt 109 lb 12.8 oz (49.805 kg)  BMI 20.08 kg/m2  LMP 12/06/2015   Subjective:    Patient ID: Summer Spencer, female    DOB: 01-27-1991, 25 y.o.   MRN: [id number]  HPI: Summer Spencer is a 25 y.o. female presenting on 12/13/2015 for Vaginal itching, swelling   HPI Vaginal discharge and irritation Patient has been having vaginal discharge and irritation that has been worsening over the past 2 days. She thought it was initially yeast infection and had a Diflucan tablet at home and took it. The discharge has not improved and may be worsened. The irritation is also worsened as well. She does get these recurrently and usually sees an OB/GYN for this. Denies any fevers or chills or dysuria or abdominal pain currently. She is sexually active with the same partner for the past 10 years and has not had any STDs.  Relevant past medical, surgical, family and social history reviewed and updated as indicated. Interim medical history since our last visit reviewed. Allergies and medications reviewed and updated.  Review of Systems  Constitutional: Negative for fever and chills.  HENT: Negative for congestion, ear discharge and ear pain.   Eyes: Negative for redness and visual disturbance.  Respiratory: Negative for chest tightness and shortness of breath.   Cardiovascular: Negative for chest pain and leg swelling.  Gastrointestinal: Negative for nausea, vomiting, abdominal pain, diarrhea and constipation.  Genitourinary: Positive for vaginal discharge and vaginal pain. Negative for dysuria, urgency, frequency, decreased urine volume, vaginal bleeding, difficulty urinating, genital sores and pelvic pain.  Musculoskeletal: Negative for back pain and gait problem.  Skin: Negative for rash.  Neurological: Negative for light-headedness and headaches.  Psychiatric/Behavioral: Negative for behavioral problems and  agitation.  All other systems reviewed and are negative.   Per HPI unless specifically indicated above     Medication List       This list is accurate as of: 12/13/15  8:24 AM.  Always use your most recent med list.               metroNIDAZOLE 500 MG tablet  Commonly known as:  FLAGYL  Take 1 tablet (500 mg total) by mouth 2 (two) times daily.           Objective:    BP 119/81 mmHg  Pulse 88  Temp(Src) 97.2 F (36.2 C) (Oral)  Ht  (1.575 m)  Wt 109 lb 12.8 oz (49.805 kg)  BMI 20.08 kg/m2  LMP 12/06/2015  Wt Readings from Last 3 Encounters:  12/13/15 109 lb 12.8 oz (49.805 kg)  11/19/13 126 lb (57.153 kg)  03/31/13 122 lb (55.339 kg)    Physical Exam  Constitutional: She is oriented to person, place, and time. She appears well-developed and well-nourished. No distress.  Eyes: Conjunctivae and EOM are normal. Pupils are equal, round, and reactive to light.  Cardiovascular: Normal rate, regular rhythm, normal heart sounds and intact distal pulses.   No murmur heard. Pulmonary/Chest: Effort normal and breath sounds normal. No respiratory distress. She has no wheezes.  Abdominal: Soft. Bowel sounds are normal. She exhibits no distension and no mass. There is no tenderness. There is no rebound and no guarding.  Genitourinary:  Patient declined exam  Musculoskeletal: Normal range of motion. She exhibits no edema or tenderness.  Neurological: She is alert and oriented to person, place, and  time. Coordination normal.  Skin: Skin is warm and dry. No rash noted. She is not diaphoretic.  Psychiatric: She has a normal mood and affect. Her behavior is normal.  Nursing note and vitals reviewed.   Results for orders placed or performed in visit on 11/19/13  Rapid Strep A  Result Value Ref Range   Rapid Strep A Screen Negative Negative      Assessment & Plan:   Problem List Items Addressed This Visit    None    Visit Diagnoses    Bacterial vaginosis    -   Primary    Relevant Medications    metroNIDAZOLE (FLAGYL) 500 MG tablet        Follow up plan: Return if symptoms worsen or fail to improve.  Counseling provided for all of the vaccine components No orders of the defined types were placed in this encounter.    Arville CareJoshua Dettinger, MD [facility] 12/13/2015, 8:24 AM

## 2015-12-18 ENCOUNTER — Telehealth: Payer: Self-pay | Admitting: Family Medicine

## 2015-12-18 MED ORDER — FLUCONAZOLE 150 MG PO TABS: 150.0000 mg | ORAL_TABLET | Freq: Once | ORAL | Status: DC

## 2015-12-18 NOTE — Telephone Encounter (Signed)
Patient states that she still has a lot of vaginal itching and burning and her symptoms have not improved. Please advise

## 2015-12-18 NOTE — Telephone Encounter (Signed)
Give her a one-time dose of Diflucan 150 mg

## 2015-12-18 NOTE — Telephone Encounter (Signed)
Patient aware.

## 2015-12-19 ENCOUNTER — Ambulatory Visit (INDEPENDENT_AMBULATORY_CARE_PROVIDER_SITE_OTHER): Payer: Self-pay | Admitting: Family

## 2015-12-19 ENCOUNTER — Encounter: Payer: Self-pay | Admitting: Family

## 2015-12-19 ENCOUNTER — Encounter (INDEPENDENT_AMBULATORY_CARE_PROVIDER_SITE_OTHER): Payer: Self-pay

## 2015-12-19 ENCOUNTER — Telehealth: Payer: Self-pay | Admitting: Family Medicine

## 2015-12-19 VITALS — BP 98/59 | HR 73 | Temp 97.7°F | Ht 62.0 in | Wt 109.6 lb

## 2015-12-19 DIAGNOSIS — N898 Other specified noninflammatory disorders of vagina: Secondary | ICD-10-CM

## 2015-12-19 DIAGNOSIS — B373 Candidiasis of vulva and vagina: Secondary | ICD-10-CM

## 2015-12-19 DIAGNOSIS — B3731 Acute candidiasis of vulva and vagina: Secondary | ICD-10-CM

## 2015-12-19 LAB — URINALYSIS, COMPLETE
Bilirubin, UA: NEGATIVE
Ketones, UA: NEGATIVE
Leukocytes, UA: NEGATIVE
Nitrite, UA: NEGATIVE
Protein, UA: NEGATIVE
Specific Gravity, UA: 1.015 (ref 1.005–1.030)
Urobilinogen, Ur: 0.2 mg/dL (ref 0.2–1.0)
pH, UA: 5.5 (ref 5.0–7.5)

## 2015-12-19 LAB — WET PREP FOR TRICH, YEAST, CLUE
Clue Cell Exam: NEGATIVE
Trichomonas Exam: NEGATIVE

## 2015-12-19 LAB — MICROSCOPIC EXAMINATION: Bacteria, UA: NONE SEEN

## 2015-12-19 MED ORDER — FLUCONAZOLE 150 MG PO TABS: 150.0000 mg | ORAL_TABLET | ORAL | Status: DC

## 2015-12-19 MED ORDER — CLOTRIMAZOLE 1 % VA CREA: 1.0000 | TOPICAL_CREAM | Freq: Every day | VAGINAL | Status: DC

## 2015-12-19 NOTE — Patient Instructions (Signed)

## 2015-12-19 NOTE — Telephone Encounter (Signed)
Patient aware that she will need to be seen if not improving. Appointment scheduled for 3:40 today.

## 2015-12-19 NOTE — Progress Notes (Signed)
   Subjective:    Patient ID: Currie ParisKayla M Lindstrom, female    DOB: 04/15/1991, 25 y.o.   MRN: [id number]  Vaginal Itching The patient's primary symptoms include genital itching and a genital odor. The patient's pertinent negatives include no vaginal bleeding or vaginal discharge. This is a recurrent problem. The current episode started 1 to 4 weeks ago. The problem occurs constantly. The problem has been unchanged. Associated symptoms include frequency and urgency. Pertinent negatives include no back pain, chills, constipation, diarrhea, discolored urine, flank pain, headaches, hematuria or painful intercourse. She has tried antifungals (treated with BV with no relief) for the symptoms. The treatment provided mild relief.      Review of Systems  Constitutional: Negative.  Negative for chills.  HENT: Negative.   Eyes: Negative.   Respiratory: Negative.  Negative for shortness of breath.   Cardiovascular: Negative.  Negative for palpitations.  Gastrointestinal: Negative.  Negative for diarrhea and constipation.  Endocrine: Negative.   Genitourinary: Positive for urgency and frequency. Negative for hematuria, flank pain and vaginal discharge.  Musculoskeletal: Negative.  Negative for back pain.  Neurological: Negative.  Negative for headaches.  Hematological: Negative.   Psychiatric/Behavioral: Negative.   All other systems reviewed and are negative.      Objective:   Physical Exam  Constitutional: She is oriented to person, place, and time. She appears well-developed and well-nourished. No distress.  HENT:  Head: Normocephalic and atraumatic.  Eyes: Pupils are equal, round, and reactive to light.  Neck: Normal range of motion. Neck supple. No thyromegaly present.  Cardiovascular: Normal rate, regular rhythm, normal heart sounds and intact distal pulses.   No murmur heard. Pulmonary/Chest: Effort normal and breath sounds normal. No respiratory distress. She has no wheezes.  Abdominal:  Soft. Bowel sounds are normal. She exhibits no distension. There is no tenderness.  Musculoskeletal: Normal range of motion. She exhibits no edema or tenderness.  Negative for CVA tenderness   Neurological: She is alert and oriented to person, place, and time.  Skin: Skin is warm and dry.  Psychiatric: She has a normal mood and affect. Her behavior is normal. Judgment and thought content normal.  Vitals reviewed.   BP 98/59 mmHg  Pulse 73  Temp(Src) 97.7 F (36.5 C) (Oral)  Ht 5\' 2"  (1.575 m)  Wt 109 lb 9.6 oz (49.714 kg)  BMI 20.04 kg/m2  LMP 12/06/2015       Assessment & Plan:  1. Vaginal irritation - Urinalysis, Complete - WET PREP FOR TRICH, YEAST, CLUE  2. Vagina, candidiasis -Keep clean and dry -Cotton underwear -RTO prn  - fluconazole (DIFLUCAN) 150 MG tablet; Take 1 tablet (150 mg total) by mouth every 3 (three) days.  Dispense: 4 tablet; Refill: 0 - clotrimazole (GYNE-LOTRIMIN) 1 % vaginal cream; Place 1 Applicatorful vaginally at bedtime.  Dispense: 45 g; Refill: 0  Jannifer Rodneyhristy Aishah Teffeteller, FNP

## 2016-01-12 ENCOUNTER — Telehealth: Payer: Self-pay | Admitting: Family

## 2016-01-12 DIAGNOSIS — B3731 Acute candidiasis of vulva and vagina: Secondary | ICD-10-CM

## 2016-01-12 DIAGNOSIS — B373 Candidiasis of vulva and vagina: Secondary | ICD-10-CM

## 2016-01-12 MED ORDER — FLUCONAZOLE 150 MG PO TABS: 150.0000 mg | ORAL_TABLET | ORAL | Status: DC

## 2016-01-12 MED ORDER — NYSTATIN 100000 UNIT/ML MT SUSP: 5.0000 mL | Freq: Four times a day (QID) | OROMUCOSAL | Status: DC

## 2016-01-12 NOTE — Telephone Encounter (Signed)
Diflucan and nystatin Prescription sent to pharmacy. Pt needs to start probiotic

## 2016-01-12 NOTE — Telephone Encounter (Signed)
Patient aware and verbalizes understanding. 

## 2016-01-16 ENCOUNTER — Telehealth: Payer: Self-pay | Admitting: Nurse Practitioner

## 2016-01-16 ENCOUNTER — Emergency Department (HOSPITAL_COMMUNITY)
Admission: EM | Admit: 2016-01-16 | Discharge: 2016-01-16 | Disposition: A | Discharge status: Home / Self Care | Payer: Medicaid Other | Attending: Emergency Medicine | Admitting: Emergency Medicine

## 2016-01-16 ENCOUNTER — Encounter (HOSPITAL_COMMUNITY): Payer: Self-pay | Admitting: Emergency Medicine

## 2016-01-16 ENCOUNTER — Encounter: Payer: Self-pay | Admitting: Nurse Practitioner

## 2016-01-16 ENCOUNTER — Ambulatory Visit (INDEPENDENT_AMBULATORY_CARE_PROVIDER_SITE_OTHER): Payer: Self-pay | Admitting: Nurse Practitioner

## 2016-01-16 VITALS — BP 114/80 | HR 112 | Temp 97.1°F | Ht 62.0 in | Wt 105.0 lb

## 2016-01-16 DIAGNOSIS — E1110 Type 2 diabetes mellitus with ketoacidosis without coma: Secondary | ICD-10-CM

## 2016-01-16 DIAGNOSIS — E119 Type 2 diabetes mellitus without complications: Secondary | ICD-10-CM | POA: Insufficient documentation

## 2016-01-16 DIAGNOSIS — R739 Hyperglycemia, unspecified: Secondary | ICD-10-CM

## 2016-01-16 DIAGNOSIS — Z13220 Encounter for screening for lipoid disorders: Secondary | ICD-10-CM

## 2016-01-16 DIAGNOSIS — Z8632 Personal history of gestational diabetes: Secondary | ICD-10-CM | POA: Insufficient documentation

## 2016-01-16 DIAGNOSIS — Z8709 Personal history of other diseases of the respiratory system: Secondary | ICD-10-CM | POA: Insufficient documentation

## 2016-01-16 DIAGNOSIS — F17210 Nicotine dependence, cigarettes, uncomplicated: Secondary | ICD-10-CM | POA: Insufficient documentation

## 2016-01-16 DIAGNOSIS — E1310 Other specified diabetes mellitus with ketoacidosis without coma: Secondary | ICD-10-CM

## 2016-01-16 DIAGNOSIS — Z79899 Other long term (current) drug therapy: Secondary | ICD-10-CM | POA: Insufficient documentation

## 2016-01-16 DIAGNOSIS — R358 Other polyuria: Secondary | ICD-10-CM | POA: Insufficient documentation

## 2016-01-16 DIAGNOSIS — R7309 Other abnormal glucose: Secondary | ICD-10-CM

## 2016-01-16 LAB — URINALYSIS, ROUTINE W REFLEX MICROSCOPIC
Bilirubin Urine: NEGATIVE
Glucose, UA: >1000 mg/dL — AB
Hgb urine dipstick: NEGATIVE
Ketones, ur: >80 mg/dL — AB
Leukocytes, UA: NEGATIVE
Nitrite: NEGATIVE
Protein, ur: NEGATIVE mg/dL
Specific Gravity, Urine: 1.046 — ABNORMAL HIGH (ref 1.005–1.030)
pH: 5 (ref 5.0–8.0)

## 2016-01-16 LAB — CMP14+EGFR
ALT: 35 IU/L — ABNORMAL HIGH (ref 0–32)
AST: 26 IU/L (ref 0–40)
Albumin/Globulin Ratio: 2 (ref 1.2–2.2)
Albumin: 4.8 g/dL (ref 3.5–5.5)
Alkaline Phosphatase: 80 IU/L (ref 39–117)
BUN/Creatinine Ratio: 15 (ref 9–23)
BUN: 10 mg/dL (ref 6–20)
Bilirubin Total: 0.7 mg/dL (ref 0.0–1.2)
CO2: 19 mmol/L (ref 18–29)
Calcium: 9.6 mg/dL (ref 8.7–10.2)
Chloride: 92 mmol/L — ABNORMAL LOW (ref 96–106)
Creatinine, Ser: 0.67 mg/dL (ref 0.57–1.00)
GFR calc Af Amer: 140 mL/min/{1.73_m2} (ref >59)
GFR calc non Af Amer: 122 mL/min/{1.73_m2} (ref >59)
Globulin, Total: 2.4 g/dL (ref 1.5–4.5)
Glucose: 369 mg/dL — ABNORMAL HIGH (ref 65–99)
Potassium: 4.3 mmol/L (ref 3.5–5.2)
Sodium: 132 mmol/L — ABNORMAL LOW (ref 134–144)
Total Protein: 7.2 g/dL (ref 6.0–8.5)

## 2016-01-16 LAB — BASIC METABOLIC PANEL
Anion gap: 14 (ref 5–15)
BUN: 8 mg/dL (ref 6–20)
CO2: 23 mmol/L (ref 22–32)
Calcium: 9.7 mg/dL (ref 8.9–10.3)
Chloride: 97 mmol/L — ABNORMAL LOW (ref 101–111)
Creatinine, Ser: 0.69 mg/dL (ref 0.44–1.00)
GFR calc Af Amer: >60 mL/min (ref >60)
GFR calc non Af Amer: >60 mL/min (ref >60)
Glucose, Bld: 305 mg/dL — ABNORMAL HIGH (ref 65–99)
Potassium: 4.1 mmol/L (ref 3.5–5.1)
Sodium: 134 mmol/L — ABNORMAL LOW (ref 135–145)

## 2016-01-16 LAB — CBC
HCT: 43.9 % (ref 36.0–46.0)
Hemoglobin: 15.9 g/dL — ABNORMAL HIGH (ref 12.0–15.0)
MCH: 32.3 pg (ref 26.0–34.0)
MCHC: 36.2 g/dL — ABNORMAL HIGH (ref 30.0–36.0)
MCV: 89 fL (ref 78.0–100.0)
Platelets: 170 10*3/uL (ref 150–400)
RBC: 4.93 MIL/uL (ref 3.87–5.11)
RDW: 11.9 % (ref 11.5–15.5)
WBC: 9.1 10*3/uL (ref 4.0–10.5)

## 2016-01-16 LAB — URINALYSIS
Bilirubin, UA: NEGATIVE
Leukocytes, UA: NEGATIVE
Nitrite, UA: NEGATIVE
Protein, UA: NEGATIVE
RBC, UA: NEGATIVE
Specific Gravity, UA: 1.01 (ref 1.005–1.030)
Urobilinogen, Ur: 0.2 mg/dL (ref 0.2–1.0)
pH, UA: 5.5 (ref 5.0–7.5)

## 2016-01-16 LAB — LIPID PANEL
Chol/HDL Ratio: 3.7 ratio units (ref 0.0–4.4)
Cholesterol, Total: 176 mg/dL (ref 100–199)
HDL: 47 mg/dL (ref >39)
LDL Calculated: 110 mg/dL — ABNORMAL HIGH (ref 0–99)
Triglycerides: 95 mg/dL (ref 0–149)
VLDL Cholesterol Cal: 19 mg/dL (ref 5–40)

## 2016-01-16 LAB — URINE MICROSCOPIC-ADD ON: Bacteria, UA: NONE SEEN

## 2016-01-16 LAB — CBG MONITORING, ED
Glucose-Capillary: 277 mg/dL — ABNORMAL HIGH (ref 65–99)
Glucose-Capillary: 274 mg/dL — ABNORMAL HIGH (ref 65–99)

## 2016-01-16 LAB — BAYER DCA HB A1C WAIVED: HB A1C (BAYER DCA - WAIVED): 13.7 % — ABNORMAL HIGH (ref <7.0)

## 2016-01-16 LAB — GLUCOSE HEMOCUE WAIVED: Glu Hemocue Waived: 304 mg/dL — ABNORMAL HIGH (ref 65–99)

## 2016-01-16 MED ORDER — SODIUM CHLORIDE 0.9 % IV BOLUS (SEPSIS)
2000.0000 mL | Freq: Once | INTRAVENOUS | Status: AC
  Administered 2016-01-16: 2000 mL via INTRAVENOUS

## 2016-01-16 MED ORDER — METFORMIN HCL 1000 MG PO TABS: 1000.0000 mg | ORAL_TABLET | Freq: Two times a day (BID) | ORAL | Status: DC

## 2016-01-16 NOTE — ED Provider Notes (Signed)
CSN: [id number]     Arrival date & time 01/16/16  1038 History   First MD Initiated Contact with Patient 01/16/16 1153     Chief Complaint  Patient presents with  . Abnormal Lab     (Consider location/radiation/quality/duration/timing/severity/associated sxs/prior Treatment) HPI   Summer Spencer is a 25 y.o. female who presents for evaluation of polyuria, and high blood sugar. Today at work. She is feeling jittery and checked her blood sugar and found it to be high. She therefore went to her PCP who checked her out, and sent her here for evaluation of hyperglycemia and urinary ketones. I also did a hemoglobin A1c, which was high at 13. She has a history of gestational diabetes during 2 pregnancies. She works as a Scientist, clinical (histocompatibility and immunogenetics). She denies recent fever, chills, cough, shortness of breath, weakness or dizziness. She has family history positive for diabetes including insulin-dependent diabetes.  Past Medical History  Diagnosis Date  . Hypoglycemia   . Bronchitis    Past Surgical History  Procedure Laterality Date  . Wisdom tooth extraction    . Hymen     Family History  Problem Relation Age of Onset  . Anesthesia problems Neg Hx   . Hypertension Other   . Hyperlipidemia Other   . Hypertension Father   . Diabetes Maternal Grandmother   . Liver disease Maternal Grandfather   . Hypertension Paternal Grandmother    Social History  Substance Use Topics  . Smoking status: Current Every Day Smoker -- 0.25 packs/day for 2 years    Types: Cigarettes    Last Attempt to Quit: 08/08/2011  . Smokeless tobacco: Never Used  . Alcohol Use: 0.5 oz/week    1 Standard drinks or equivalent per week   OB History    Gravida Para Term Preterm AB TAB SAB Ectopic Multiple Living   0 0 0 0 0 0 2     Review of Systems  All other systems reviewed and are negative.     Allergies  Septra and Sulfamethoxazole  Home Medications   Prior to Admission medications   Medication Sig Start Date  End Date Taking? Authorizing Provider  Cyanocobalamin (VITAMIN B 12 PO) Take 1 tablet by mouth daily.   Yes Historical Provider, MD  fluconazole (DIFLUCAN) 150 MG tablet Take 150 mg by mouth every 3 (three) days. 12/19/15  Yes Historical Provider, MD  Omega-3 Fatty Acids (FISH OIL PO) Take 1 capsule by mouth daily.   Yes Historical Provider, MD  Probiotic Product (PROBIOTIC PO) Take 1 tablet by mouth daily.   Yes Historical Provider, MD  metFORMIN (GLUCOPHAGE) 1000 MG tablet Take 1 tablet (1,000 mg total) by mouth 2 (two) times daily. 01/16/16   Mancel Bale, MD   BP 111/80 mmHg  Pulse 102  Temp(Src) 98.3 F (36.8 C) (Oral)  Resp 14  Ht  (1.575 m)  Wt 105 lb (47.628 kg)  BMI 19.20 kg/m2  SpO2 100%  LMP 01/04/2016 Physical Exam  Constitutional: She is oriented to person, place, and time. She appears well-developed and well-nourished. No distress.  HENT:  Head: Normocephalic and atraumatic.  Right Ear: External ear normal.  Left Ear: External ear normal.  Lips and tongue are dry  Eyes: Conjunctivae and EOM are normal. Pupils are equal, round, and reactive to light.  Neck: Normal range of motion and phonation normal. Neck supple.  Cardiovascular: Normal rate, regular rhythm and normal heart sounds.   Pulmonary/Chest: Effort normal and breath sounds  normal. No respiratory distress. She exhibits no bony tenderness.  Abdominal: Soft. She exhibits no distension. There is no tenderness.  Musculoskeletal: Normal range of motion. She exhibits no edema.  Neurological: She is alert and oriented to person, place, and time. No cranial nerve deficit or sensory deficit. She exhibits normal muscle tone. Coordination normal.  Skin: Skin is warm, dry and intact.  Psychiatric: She has a normal mood and affect. Her behavior is normal. Judgment and thought content normal.  Nursing note and vitals reviewed.   ED Course  Procedures (including critical care time)  Medications  sodium chloride 0.9  % bolus 2,000 mL (0 mLs Intravenous Stopped 01/16/16 1433)    Patient Vitals for the past 24 hrs:  BP Temp Temp src Pulse Resp SpO2 Height Weight  01/16/16 1430 111/80 mmHg - - 102 - 100 % - -  01/16/16 1330 102/62 mmHg - - 60 - 100 % - -  01/16/16 1300 114/68 mmHg - - 78 - 98 % - -  01/16/16 1230 106/75 mmHg - - 87 - 99 % - -  01/16/16 1200 106/75 mmHg - - 98 - 97 % - -  01/16/16 1130 107/86 mmHg - - 107 - 99 % - -  01/16/16 1046 125/90 mmHg 98.3 F (36.8 C) Oral (!) 121 14 99 % 5\' 2"  (1.575 m) 105 lb (47.628 kg)    At D/C Reevaluation with update and discussion. After initial assessment and treatment, an updated evaluation reveals  CBG improved, Pt remains comfortable. Findings discussed with patient, all questions answered. [name] L    Labs Review Labs Reviewed  BASIC METABOLIC PANEL - Abnormal; Notable for the following:    Sodium 134 (*)    Chloride 97 (*)    Glucose, Bld 305 (*)    All other components within normal limits  CBC - Abnormal; Notable for the following:    Hemoglobin 15.9 (*)    MCHC 36.2 (*)    All other components within normal limits  URINALYSIS, ROUTINE W REFLEX MICROSCOPIC (NOT AT [facility]) - Abnormal; Notable for the following:    Specific Gravity, Urine 1.046 (*)    Glucose, UA >1000 (*)    Ketones, ur >80 (*)    All other components within normal limits  URINE MICROSCOPIC-ADD ON - Abnormal; Notable for the following:    Squamous Epithelial / LPF 0-5 (*)    All other components within normal limits  CBG MONITORING, ED - Abnormal; Notable for the following:    Glucose-Capillary 277 (*)    All other components within normal limits  CBG MONITORING, ED - Abnormal; Notable for the following:    Glucose-Capillary 274 (*)    All other components within normal limits    Imaging Review No results found. I have personally reviewed and evaluated these images and lab results as part of my medical decision-making.   EKG Interpretation None      MDM    Final diagnoses:  Diabetes mellitus, new onset (HCC)    New-onset diabetes, with normal anion gap. She does appear significantly dehydrated. She is tolerating oral nutrition and fluids at home. No indication for hospitalization at this time. Doubt serous bacterial infection. Metabolic instability or impending vascular collapse.   Nursing Notes Reviewed/ Care Coordinated Applicable Imaging Reviewed Interpretation of Laboratory Data incorporated into ED treatment  The patient appears reasonably screened and/or stabilized for discharge and I doubt any other medical condition or other [facility] requiring further screening, evaluation, or treatment in  the ED at this time prior to discharge.  Plan: Home Medications- Metformin; Home Treatments- drink 2-3 L water per day; return here if the recommended treatment, does not improve the symptoms; Recommended follow up- PCP 1 week     Mancel Bale, MD 01/16/16 2025

## 2016-01-16 NOTE — Discharge Instructions (Signed)
Drink 2-3 L of water each day. Be very careful about carbohydrate intake. Look at the dietary instructions below.   Type 2 Diabetes Mellitus, Adult Type 2 diabetes mellitus, often simply referred to as type 2 diabetes, is a long-lasting (chronic) disease. In type 2 diabetes, the pancreas does not make enough insulin (a hormone), the cells are less responsive to the insulin that is made (insulin resistance), or both. Normally, insulin moves sugars from food into the tissue cells. The tissue cells use the sugars for energy. The lack of insulin or the lack of normal response to insulin causes excess sugars to build up in the blood instead of going into the tissue cells. As a result, high blood sugar (hyperglycemia) develops. The effect of high sugar (glucose) levels can cause many complications. Type 2 diabetes was also previously called adult-onset diabetes, but it can occur at any age.  RISK FACTORS  A person is predisposed to developing type 2 diabetes if someone in the family has the disease and also has one or more of the following primary risk factors:  Weight gain, or being overweight or obese.  An inactive lifestyle.  A history of consistently eating high-calorie foods. Maintaining a normal weight and regular physical activity can reduce the chance of developing type 2 diabetes. SYMPTOMS  A person with type 2 diabetes may not show symptoms initially. The symptoms of type 2 diabetes appear slowly. The symptoms include:  Increased thirst (polydipsia).  Increased urination (polyuria).  Increased urination during the night (nocturia).  Sudden or unexplained weight changes.  Frequent, recurring infections.  Tiredness (fatigue).  Weakness.  Vision changes, such as blurred vision.  Fruity smell to your breath.  Abdominal pain.  Nausea or vomiting.  Cuts or bruises which are slow to heal.  Tingling or numbness in the hands or feet.  An open skin wound  (ulcer). DIAGNOSIS Type 2 diabetes is frequently not diagnosed until complications of diabetes are present. Type 2 diabetes is diagnosed when symptoms or complications are present and when blood glucose levels are increased. Your blood glucose level may be checked by one or more of the following blood tests:  A fasting blood glucose test. You will not be allowed to eat for at least 8 hours before a blood sample is taken.  A random blood glucose test. Your blood glucose is checked at any time of the day regardless of when you ate.  A hemoglobin A1c blood glucose test. A hemoglobin A1c test provides information about blood glucose control over the previous 3 months.  An oral glucose tolerance test (OGTT). Your blood glucose is measured after you have not eaten (fasted) for 2 hours and then after you drink a glucose-containing beverage. TREATMENT   You may need to take insulin or diabetes medicine daily to keep blood glucose levels in the desired range.  If you use insulin, you may need to adjust the dosage depending on the carbohydrates that you eat with each meal or snack.  Lifestyle changes are recommended as part of your treatment. These may include:  Following an individualized diet plan developed by a nutritionist or dietitian.  Exercising daily. Your health care providers will set individualized treatment goals for you based on your age, your medicines, how long you have had diabetes, and any other medical conditions you have. Generally, the goal of treatment is to maintain the following blood glucose levels:  Before meals (preprandial): 80-130 mg/dL.  After meals (postprandial): below 180 mg/dL.  A1c: less than  6.5-7%. HOME CARE INSTRUCTIONS   Have your hemoglobin A1c level checked twice a year.  Perform daily blood glucose monitoring as directed by your health care provider.  Monitor urine ketones when you are ill and as directed by your health care provider.  Take your  diabetes medicine or insulin as directed by your health care provider to maintain your blood glucose levels in the desired range.  Never run out of diabetes medicine or insulin. It is needed every day.  If you are using insulin, you may need to adjust the amount of insulin given based on your intake of carbohydrates. Carbohydrates can raise blood glucose levels but need to be included in your diet. Carbohydrates provide vitamins, minerals, and fiber which are an essential part of a healthy diet. Carbohydrates are found in fruits, vegetables, whole grains, dairy products, legumes, and foods containing added sugars.  Eat healthy foods. You should make an appointment to see a registered dietitian to help you create an eating plan that is right for you.  Lose weight if you are overweight.  Carry a medical alert card or wear your medical alert jewelry.  Carry a 15-gram carbohydrate snack with you at all times to treat low blood glucose (hypoglycemia). Some examples of 15-gram carbohydrate snacks include:  Glucose tablets, 3 or 4.  Glucose gel, 15-gram tube.  Raisins, 2 tablespoons (24 grams).  Jelly beans, 6.  Animal crackers, 8.  Regular pop, 4 ounces (120 mL).  Gummy treats, 9.  Recognize hypoglycemia. Hypoglycemia occurs with blood glucose levels of 70 mg/dL and below. The risk for hypoglycemia increases when fasting or skipping meals, during or after intense exercise, and during sleep. Hypoglycemia symptoms can include:  Tremors or shakes.  Decreased ability to concentrate.  Sweating.  Increased heart rate.  Headache.  Dry mouth.  Hunger.  Irritability.  Anxiety.  Restless sleep.  Altered speech or coordination.  Confusion.  Treat hypoglycemia promptly. If you are alert and able to safely swallow, follow the 15:15 rule:  Take 15-20 grams of rapid-acting glucose or carbohydrate. Rapid-acting options include glucose gel, glucose tablets, or 4 ounces (120 mL) of  fruit juice, regular soda, or low-fat milk.  Check your blood glucose level 15 minutes after taking the glucose.  Take 15-20 grams more of glucose if the repeat blood glucose level is still 70 mg/dL or below.  Eat a meal or snack within 1 hour once blood glucose levels return to normal.  Be alert to feeling very thirsty and urinating more frequently than usual, which are early signs of hyperglycemia. An early awareness of hyperglycemia allows for prompt treatment. Treat hyperglycemia as directed by your health care provider.  Engage in at least 150 minutes of moderate-intensity physical activity a week, spread over at least 3 days of the week or as directed by your health care provider. In addition, you should engage in resistance exercise at least 2 times a week or as directed by your health care provider. Try to spend no more than 90 minutes at one time inactive.  Adjust your medicine and food intake as needed if you start a new exercise or sport.  Follow your sick-day plan anytime you are unable to eat or drink as usual.  Do not use any tobacco products including cigarettes, chewing tobacco, or electronic cigarettes. If you need help quitting, ask your health care provider.  Limit alcohol intake to no more than 1 drink per day for nonpregnant women and 2 drinks per day for men.  You should drink alcohol only when you are also eating food. Talk with your health care provider whether alcohol is safe for you. Tell your health care provider if you drink alcohol several times a week.  Keep all follow-up visits as directed by your health care provider. This is important.  Schedule an eye exam soon after the diagnosis of type 2 diabetes and then annually.  Perform daily skin and foot care. Examine your skin and feet daily for cuts, bruises, redness, nail problems, bleeding, blisters, or sores. A foot exam by a health care provider should be done annually.  Brush your teeth and gums at least  twice a day and floss at least once a day. Follow up with your dentist regularly.  Share your diabetes management plan with your workplace or school.  Keep your immunizations up to date. It is recommended that you receive a flu (influenza) vaccine every year. It is also recommended that you receive a pneumonia (pneumococcal) vaccine. If you are 61 years of age or older and have never received a pneumonia vaccine, this vaccine may be given as a series of two separate shots. Ask your health care provider which additional vaccines may be recommended.  Learn to manage stress.  Obtain ongoing diabetes education and support as needed.  Participate in or seek rehabilitation as needed to maintain or improve independence and quality of life. Request a physical or occupational therapy referral if you are having foot or hand numbness, or difficulties with grooming, dressing, eating, or physical activity. SEEK MEDICAL CARE IF:   You are unable to eat food or drink fluids for more than 6 hours.  You have nausea and vomiting for more than 6 hours.  Your blood glucose level is over 240 mg/dL.  There is a change in mental status.  You develop an additional serious illness.  You have diarrhea for more than 6 hours.  You have been sick or have had a fever for a couple of days and are not getting better.  You have pain during any physical activity.  SEEK IMMEDIATE MEDICAL CARE IF:  You have difficulty breathing.  You have moderate to large ketone levels.   This information is not intended to replace advice given to you by your health care provider. Make sure you discuss any questions you have with your health care provider.   Document Released: 08/16/2005 Document Revised: 05/07/2015 Document Reviewed: 03/14/2012 Elsevier Interactive Patient Education 2016 [employer]  Hyperglycemia Hyperglycemia occurs when the glucose (sugar) in your blood is too high. Hyperglycemia can happen for many  reasons, but it most often happens to people who do not know they have diabetes or are not managing their diabetes properly.  CAUSES  Whether you have diabetes or not, there are other causes of hyperglycemia. Hyperglycemia can occur when you have diabetes, but it can also occur in other situations that you might not be as aware of, such as: Diabetes  If you have diabetes and are having problems controlling your blood glucose, hyperglycemia could occur because of some of the following reasons:  Not following your meal plan.  Not taking your diabetes medications or not taking it properly.  Exercising less or doing less activity than you normally do.  Being sick. Pre-diabetes  This cannot be ignored. Before people develop Type 2 diabetes, they almost always have "pre-diabetes." This is when your blood glucose levels are higher than normal, but not yet high enough to be diagnosed as diabetes. Research has shown  that some long-term damage to the body, especially the heart and circulatory system, may already be occurring during pre-diabetes. If you take action to manage your blood glucose when you have pre-diabetes, you may delay or prevent Type 2 diabetes from developing. Stress  If you have diabetes, you may be "diet" controlled or on oral medications or insulin to control your diabetes. However, you may find that your blood glucose is higher than usual in the hospital whether you have diabetes or not. This is often referred to as "stress hyperglycemia." Stress can elevate your blood glucose. This happens because of hormones put out by the body during times of stress. If stress has been the cause of your high blood glucose, it can be followed regularly by your caregiver. That way he/she can make sure your hyperglycemia does not continue to get worse or progress to diabetes. Steroids  Steroids are medications that act on the infection fighting system (immune system) to block inflammation or  infection. One side effect can be a rise in blood glucose. Most people can produce enough extra insulin to allow for this rise, but for those who cannot, steroids make blood glucose levels go even higher. It is not unusual for steroid treatments to "uncover" diabetes that is developing. It is not always possible to determine if the hyperglycemia will go away after the steroids are stopped. A special blood test called an A1c is sometimes done to determine if your blood glucose was elevated before the steroids were started. SYMPTOMS  Thirsty.  Frequent urination.  Dry mouth.  Blurred vision.  Tired or fatigue.  Weakness.  Sleepy.  Tingling in feet or leg. DIAGNOSIS  Diagnosis is made by monitoring blood glucose in one or all of the following ways:  A1c test. This is a chemical found in your blood.  Fingerstick blood glucose monitoring.  Laboratory results. TREATMENT  First, knowing the cause of the hyperglycemia is important before the hyperglycemia can be treated. Treatment may include, but is not be limited to:  Education.  Change or adjustment in medications.  Change or adjustment in meal plan.  Treatment for an illness, infection, etc.  More frequent blood glucose monitoring.  Change in exercise plan.  Decreasing or stopping steroids.  Lifestyle changes. HOME CARE INSTRUCTIONS   Test your blood glucose as directed.  Exercise regularly. Your caregiver will give you instructions about exercise. Pre-diabetes or diabetes which comes on with stress is helped by exercising.  Eat wholesome, balanced meals. Eat often and at regular, fixed times. Your caregiver or nutritionist will give you a meal plan to guide your sugar intake.  Being at an ideal weight is important. If needed, losing as little as 10 to 15 pounds may help improve blood glucose levels. SEEK MEDICAL CARE IF:   You have questions about medicine, activity, or diet.  You continue to have symptoms  (problems such as increased thirst, urination, or weight gain). SEEK IMMEDIATE MEDICAL CARE IF:   You are vomiting or have diarrhea.  Your breath smells fruity.  You are breathing faster or slower.  You are very sleepy or incoherent.  You have numbness, tingling, or pain in your feet or hands.  You have chest pain.  Your symptoms get worse even though you have been following your caregiver's orders.  If you have any other questions or concerns.   This information is not intended to replace advice given to you by your health care provider. Make sure you discuss any questions you have  with your health care provider.   Document Released: 02/09/2001 Document Revised: 11/08/2011 Document Reviewed: 04/22/2015 Elsevier Interactive Patient Education 2016 [employer]   Diabetes Mellitus and Food It is important for you to manage your blood sugar (glucose) level. Your blood glucose level can be greatly affected by what you eat. Eating healthier foods in the appropriate amounts throughout the day at about the same time each day will help you control your blood glucose level. It can also help slow or prevent worsening of your diabetes mellitus. Healthy eating may even help you improve the level of your blood pressure and reach or maintain a healthy weight.  General recommendations for healthful eating and cooking habits include:  Eating meals and snacks regularly. Avoid going long periods of time without eating to lose weight.  Eating a diet that consists mainly of plant-based foods, such as fruits, vegetables, nuts, legumes, and whole grains.  Using low-heat cooking methods, such as baking, instead of high-heat cooking methods, such as deep frying. Work with your dietitian to make sure you understand how to use the Nutrition Facts information on food labels. HOW CAN FOOD AFFECT ME? Carbohydrates Carbohydrates affect your blood glucose level more than any other type of food. Your  dietitian will help you determine how many carbohydrates to eat at each meal and teach you how to count carbohydrates. Counting carbohydrates is important to keep your blood glucose at a healthy level, especially if you are using insulin or taking certain medicines for diabetes mellitus. Alcohol Alcohol can cause sudden decreases in blood glucose (hypoglycemia), especially if you use insulin or take certain medicines for diabetes mellitus. Hypoglycemia can be a life-threatening condition. Symptoms of hypoglycemia (sleepiness, dizziness, and disorientation) are similar to symptoms of having too much alcohol.  If your health care provider has given you approval to drink alcohol, do so in moderation and use the following guidelines:  Women should not have more than one drink per day, and men should not have more than two drinks per day. One drink is equal to:  12 oz of beer.  5 oz of wine.  1 oz of hard liquor.  Do not drink on an empty stomach.  Keep yourself hydrated. Have water, diet soda, or unsweetened iced tea.  Regular soda, juice, and other mixers might contain a lot of carbohydrates and should be counted. WHAT FOODS ARE NOT RECOMMENDED? As you make food choices, it is important to remember that all foods are not the same. Some foods have fewer nutrients per serving than other foods, even though they might have the same number of calories or carbohydrates. It is difficult to get your body what it needs when you eat foods with fewer nutrients. Examples of foods that you should avoid that are high in calories and carbohydrates but low in nutrients include:  Trans fats (most processed foods list trans fats on the Nutrition Facts label).  Regular soda.  Juice.  Candy.  Sweets, such as cake, pie, doughnuts, and cookies.  Fried foods. WHAT FOODS CAN I EAT? Eat nutrient-rich foods, which will nourish your body and keep you healthy. The food you should eat also will depend on several  factors, including:  The calories you need.  The medicines you take.  Your weight.  Your blood glucose level.  Your blood pressure level.  Your cholesterol level. You should eat a variety of foods, including:  Protein.  Lean cuts of meat.  Proteins low in saturated fats, such as fish, egg  whites, and beans. Avoid processed meats.  Fruits and vegetables.  Fruits and vegetables that may help control blood glucose levels, such as apples, mangoes, and yams.  Dairy products.  Choose fat-free or low-fat dairy products, such as milk, yogurt, and cheese.  Grains, bread, pasta, and rice.  Choose whole grain products, such as multigrain bread, whole oats, and brown rice. These foods may help control blood pressure.  Fats.  Foods containing healthful fats, such as nuts, avocado, olive oil, canola oil, and fish. DOES EVERYONE WITH DIABETES MELLITUS HAVE THE SAME MEAL PLAN? Because every person with diabetes mellitus is different, there is not one meal plan that works for everyone. It is very important that you meet with a dietitian who will help you create a meal plan that is just right for you.   This information is not intended to replace advice given to you by your health care provider. Make sure you discuss any questions you have with your health care provider.   Document Released: 05/13/2005 Document Revised: 09/06/2014 Document Reviewed: 07/13/2013 Elsevier Interactive Patient Education [employer]

## 2016-01-16 NOTE — Progress Notes (Signed)
   Subjective:    Patient ID: Summer Spencer, female    DOB: 07/08/1991, 25 y.o.   MRN: [id number]  HPI Patient in today stating that blood sugar was 420 this morning at work. SHe has been very thirsty and voiding a lot. SHe has lost over 20lbs in the last month and she has had frequent vaginal candidiasis as well as thrush that develop 1 week ago.    Review of Systems  Constitutional: Positive for unexpected weight change (weight loss).  HENT: Negative.   Respiratory: Negative.  Negative for cough.   Cardiovascular: Negative.   Gastrointestinal: Negative.   Endocrine: Positive for polydipsia, polyphagia and polyuria.  Musculoskeletal: Negative.   Neurological: Negative.   Psychiatric/Behavioral: Negative.   All other systems reviewed and are negative.      Objective:   Physical Exam  Constitutional: She appears well-developed and well-nourished. No distress.  Cardiovascular: Normal rate, regular rhythm and normal heart sounds.   Pulmonary/Chest: Effort normal.  Neurological: She is alert.  Skin: Skin is warm.  Psychiatric: She has a normal mood and affect. Her behavior is normal. Judgment and thought content normal.  Very anxious and jittery   BP 114/80 mmHg  Pulse 112  Temp(Src) 97.1 F (36.2 C) (Oral)  Ht 5\' 2"  (1.575 m)  Wt 105 lb (47.628 kg)  BMI 19.20 kg/m2  hgba1c 13.7% and blood sugar 304 2+ ketones in urine and 2+ protein     Assessment & Plan:   1. Elevated blood sugar   2. Screening, lipid   3. Diabetic ketoacidosis without coma associated with type 2 diabetes mellitus [facility](HCC)    To hospital RTO for hospital follow up when indicated  Mary-Margaret Daphine DeutscherMartin, FNP

## 2016-01-16 NOTE — Patient Instructions (Signed)
Diabetic Ketoacidosis °Diabetic ketoacidosis is a life-threatening complication of diabetes. If it is not treated, it can cause severe dehydration and organ damage and can lead to a coma or death. °CAUSES °This condition develops when there is not enough of the hormone insulin in the body. Insulin helps the body to break down sugar for energy. Without insulin, the body cannot break down sugar, so it breaks down fats instead. This leads to the production of acids that are called ketones. Ketones are poisonous at high levels. °This condition can be triggered by: °· Stress on the body that is brought on by an illness. °· Medicines that raise blood glucose levels. °· Not taking diabetes medicine. °SYMPTOMS °Symptoms of this condition include: °· Fatigue. °· Weight loss. °· Excessive thirst. °· Light-headedness. °· Fruity or sweet-smelling breath. °· Excessive urination. °· Vision changes. °· Confusion or irritability. °· Nausea. °· Vomiting. °· Rapid breathing. °· Abdominal pain. °· Feeling flushed. °DIAGNOSIS °This condition is diagnosed based on a medical history, a physical exam, and blood tests. You may also have a urine test that checks for ketones. °TREATMENT °This condition may be treated with: °· Fluid replacement. This may be done to correct dehydration. °· Insulin injections. These may be given through the skin or through an IV tube. °· Electrolyte replacement. Electrolytes, such as potassium and sodium, may be given in pill form or through an IV tube. °· Antibiotic medicines. These may be prescribed if your condition was caused by an infection. °HOME CARE INSTRUCTIONS °Eating and Drinking °· Drink enough fluids to keep your urine clear or pale yellow. °· If you cannot eat, alternate between drinking fluids with sugar (such as juice) and salty fluids (such as broth or bouillon). °· If you can eat, follow your usual diet and drink sugar-free liquids, such as water. °Other Instructions °· Take insulin as  directed by your health care provider. Do not skip insulin injections. Do not use expired insulin. °· If your blood sugar is over 240 mg/dL, monitor your urine ketones every 4-6 hours. °· If you were prescribed an antibiotic medicine, finish all of it even if you start to feel better. °· Rest and exercise only as directed by your health care provider. °· If you get sick, call your health care provider and begin treatment quickly. Your body often needs extra insulin to fight an illness. °· Check your blood glucose levels regularly. If your blood glucose is high, drink plenty of fluids. This helps to flush out ketones. °SEEK MEDICAL CARE IF: °· Your blood glucose level is too high or too low. °· You have ketones in your urine. °· You have a fever. °· You cannot eat. °· You cannot tolerate fluids. °· You have been vomiting for more than 2 hours. °· You continue to have symptoms of this condition. °· You develop new symptoms. °SEEK IMMEDIATE MEDICAL CARE IF: °· Your blood glucose levels continue to be high (elevated). °· Your monitor reads "high" even when you are taking insulin. °· You faint. °· You have chest pain. °· You have trouble breathing. °· You have a sudden, severe headache. °· You have sudden weakness in one arm or one leg. °· You have sudden trouble speaking or swallowing. °· You have vomiting or diarrhea that gets worse after 3 hours. °· You feel severely fatigued. °· You have trouble thinking. °· You have abdominal pain. °· You are severely dehydrated. Symptoms of severe dehydration include: °¨ Extreme thirst. °¨ Dry mouth. °¨ Blue lips. °¨   Cold hands and feet. °¨ Rapid breathing. °  °This information is not intended to replace advice given to you by your health care provider. Make sure you discuss any questions you have with your health care provider. °  °Document Released: 08/13/2000 Document Revised: 12/31/2014 Document Reviewed: 07/24/2014 °Elsevier Interactive Patient Education ©2016 Elsevier  Inc. ° °

## 2016-01-16 NOTE — Telephone Encounter (Signed)
Pt given appt with MMM 5/24 at 8:30 am.

## 2016-01-16 NOTE — ED Notes (Addendum)
[facility] for high blood sugar (420). There they found ketones in her urine and her HbA1c level was 13. Pt reports having yeast infection, thrush in mouth, weight loss and increased urination. Pt denies history of Diabetes before today.

## 2016-01-21 ENCOUNTER — Ambulatory Visit (INDEPENDENT_AMBULATORY_CARE_PROVIDER_SITE_OTHER): Payer: Self-pay | Admitting: Nurse Practitioner

## 2016-01-21 ENCOUNTER — Encounter: Payer: Self-pay | Admitting: Nurse Practitioner

## 2016-01-21 VITALS — BP 91/57 | HR 66 | Temp 97.1°F | Ht 62.0 in | Wt 107.0 lb

## 2016-01-21 DIAGNOSIS — E1165 Type 2 diabetes mellitus with hyperglycemia: Secondary | ICD-10-CM

## 2016-01-21 LAB — HM DIABETES EYE EXAM

## 2016-01-21 NOTE — Progress Notes (Signed)
   Subjective:    Patient ID: Currie ParisKayla M Emanuelson, female    DOB: 02/23/1991, 25 y.o.   MRN: [id number]  HPI Patient was seen in office on 01/16/16 with blood sugar at work over 400. She had 3+ ketones in urine as well as #+ glucose- she was sent to ER. They did anion gap on her which was normal so they determined that she was not in ketoacidosis. SHe was started on metformin 1000mg  BID and was discharged home. Blood sugars are still running high in mornings. Over 200 on average.    Review of Systems  Constitutional: Negative.   HENT: Negative.   Respiratory: Negative.   Cardiovascular: Negative.   Genitourinary: Negative.   Neurological: Negative.   Psychiatric/Behavioral: Negative.   All other systems reviewed and are negative.      Objective:   Physical Exam  Constitutional: She is oriented to person, place, and time. She appears well-developed and well-nourished. No distress.  Cardiovascular: Normal rate, regular rhythm and normal heart sounds.   Pulmonary/Chest: Effort normal and breath sounds normal.  Neurological: She is alert and oriented to person, place, and time.  Skin: Skin is warm.  Psychiatric: She has a normal mood and affect. Her behavior is normal. Judgment and thought content normal.    BP 91/57 mmHg  Pulse 66  Temp(Src) 97.1 F (36.2 C) (Oral)  Ht 5\' 2"  (1.575 m)  Wt 107 lb (48.535 kg)  BMI 19.57 kg/m2  LMP 01/04/2016       Assessment & Plan:   1. Type 2 diabetes mellitus with hyperglycemia, without long-term current use of insulin (HCC)    Continue metformin as rx Appointment with clinical pharmacist for diabetic education Continue  To keep diary of blood sugars and diet RTO in 3 months for Hgba1c testing  Mary-Margaret Daphine DeutscherMartin, FNP

## 2016-01-21 NOTE — Patient Instructions (Signed)

## 2016-02-04 ENCOUNTER — Ambulatory Visit (INDEPENDENT_AMBULATORY_CARE_PROVIDER_SITE_OTHER): Payer: Self-pay | Admitting: Pharmacist

## 2016-02-04 VITALS — BP 102/64 | HR 88 | Ht 62.0 in | Wt 108.0 lb

## 2016-02-04 DIAGNOSIS — IMO0001 Reserved for inherently not codable concepts without codable children: Secondary | ICD-10-CM

## 2016-02-04 DIAGNOSIS — E1165 Type 2 diabetes mellitus with hyperglycemia: Secondary | ICD-10-CM

## 2016-02-04 NOTE — Patient Instructions (Signed)
Diabetes and Standards of Medical Care   Diabetes is complicated. You may find that your diabetes team includes a dietitian, nurse, diabetes educator, eye doctor, and more. To help everyone know what is going on and to help you get the care you deserve, the following schedule of care was developed to help keep you on track. Below are the tests, exams, vaccines, medicines, education, and plans you will need.  Blood Glucose Goals Prior to meals = 80 - 130 Within 2 hours of the start of a meal = less than 180  HbA1c test (goal is less than 6.5% - your last value was 13.7%) This test shows how well you have controlled your glucose over the past 2 to 3 months. It is used to see if your diabetes management plan needs to be adjusted.   It is performed at least 2 times a year if you are meeting treatment goals.  It is performed 4 times a year if therapy has changed or if you are not meeting treatment goals.  Blood pressure test  This test is performed at every routine medical visit. The goal is less than 140/90 mmHg for most people, but 130/80 mmHg in some cases. Ask your health care provider about your goal.  Dental exam  Follow up with the dentist regularly.  Eye exam  If you are diagnosed with type 1 diabetes as a child, get an exam upon reaching the age of 43 years or older and have had diabetes for 3 to 5 years. Yearly eye exams are recommended after that initial eye exam.  If you are diagnosed with type 1 diabetes as an adult, get an exam within 5 years of diagnosis and then yearly.  If you are diagnosed with type 2 diabetes, get an exam as soon as possible after the diagnosis and then yearly.  Foot care exam  Visual foot exams are performed at every routine medical visit. The exams check for cuts, injuries, or other problems with the feet.  A comprehensive foot exam should be done yearly. This includes visual inspection as well as assessing foot pulses and testing for loss of  sensation.  Check your feet nightly for cuts, injuries, or other problems with your feet. Tell your health care provider if anything is not healing.  Kidney function test (urine microalbumin)  This test is performed once a year.  Type 1 diabetes: The first test is performed 5 years after diagnosis.  Type 2 diabetes: The first test is performed at the time of diagnosis.  A serum creatinine and estimated glomerular filtration rate (eGFR) test is done once a year to assess the level of chronic kidney disease (CKD), if present.  Lipid profile (cholesterol, HDL, LDL, triglycerides)  Performed every 5 years for most people.  The goal for LDL is less than 100 mg/dL. If you are at high risk, the goal is less than 70 mg/dL.  The goal for HDL is 40 mg/dL to 50 mg/dL for men and 50 mg/dL to 60 mg/dL for women. An HDL cholesterol of 60 mg/dL or higher gives some protection against heart disease.  The goal for triglycerides is less than 150 mg/dL.  Influenza vaccine, pneumococcal vaccine, and hepatitis B vaccine  The influenza vaccine is recommended yearly.  The pneumococcal vaccine is generally given once in a lifetime. However, there are some instances when another vaccination is recommended. Check with your health care provider.  The hepatitis B vaccine is also recommended for adults with diabetes.  Diabetes self-management education  Education is recommended at diagnosis and ongoing as needed.  Treatment plan  Your treatment plan is reviewed at every medical visit.  Document Released: 06/13/2009 Document Revised: 04/18/2013 Document Reviewed: 01/16/2013 Exit[employer] Patient Information 2014 [employer].   

## 2016-02-05 ENCOUNTER — Encounter: Payer: Self-pay | Admitting: Pharmacist

## 2016-02-05 DIAGNOSIS — E1165 Type 2 diabetes mellitus with hyperglycemia: Secondary | ICD-10-CM

## 2016-02-05 DIAGNOSIS — IMO0001 Reserved for inherently not codable concepts without codable children: Secondary | ICD-10-CM | POA: Insufficient documentation

## 2016-02-05 NOTE — Progress Notes (Signed)
Subjective:    Summer Spencer is a 25 y.o. female who presents for Initial evaluation and education of Type 2 diabetes mellitus.  Summer Spencer was recently diagnosed with DM.  She was having lots of polyuria and polydipsia prior to diagnosis but this has resolved. She has a maternal GM with DM.  She also reports that she had gestational diabetes.   Known diabetic complications: none Cardiovascular risk factors: none Current diabetic medications include metformin 1000mg  bid.  Tolerating well - no diarrhea. .   Eye exam current (within one year): no Weight trend: stable Prior visit with dietician: no Current diet: in general, a "healthy" diet   Current exercise: walking  Current monitoring regimen: home blood tests - 2-3 times daily Home blood sugar records: ranges from 82 - 260 (over the last 2 weeks has greatly improved) Any episodes of hypoglycemia? No but when BG was 82 to felt was low and had symptoms similar to hypoglycemia.  Is She on ACE inhibitor or angiotensin II receptor blocker?  No    Objective:    LMP 01/04/2016   A1c = 13.7% (01/16/2016)  Lab Review GLUCOSE (mg/dL)  Date Value  16/10/960405/19/2017 369*   GLUCOSE, BLD (mg/dL)  Date Value  54/09/811905/19/2017 305*  12/08/2008 86   CO2  Date Value  01/16/2016 23 mmol/L  01/16/2016 19 mmol/L  12/08/2008 22 mEq/L   BUN (mg/dL)  Date Value  14/78/295605/19/2017 8  01/16/2016 10  12/08/2008 4*   CREATININE, SER (mg/dL)  Date Value  21/30/865705/19/2017 0.69  01/16/2016 0.67  12/08/2008 0.40    Assessment:    Diabetes Mellitus type II, under improving control.    Plan:    1.  Rx changes: none 2.  Education: Reviewed 'ABCs' of diabetes management (respective goals in parentheses):  A1C (<7), blood pressure (<130/80), and cholesterol (LDL <100). 3. Discussed pathophysiology of DM; difference between type 1 and type 2 DM.  Also discussed s/s of hypoglycemia and how to treat.   4. CHO counting diet discussed.  Reviewed CHO amount in various  foods and how to read nutrition labels.  Discussed recommended serving sizes.  5.  Recommended increase physical activity - goal is 150 minutes per week 4. Follow up: 3 months

## 2016-02-16 ENCOUNTER — Other Ambulatory Visit: Payer: Self-pay | Admitting: Family Medicine

## 2016-02-16 MED ORDER — METFORMIN HCL 1000 MG PO TABS: 1000.0000 mg | ORAL_TABLET | Freq: Two times a day (BID) | ORAL | Status: DC

## 2016-02-16 NOTE — Telephone Encounter (Signed)
done

## 2016-02-25 ENCOUNTER — Telehealth: Payer: Self-pay | Admitting: Family Medicine

## 2016-02-25 NOTE — Telephone Encounter (Signed)
BG has been in 200's but also has a 77 last weekend.  Patient advised to send me records of BG and food she is eating.  She will email information to me.  I will review and then communicate any changes recommended.

## 2016-03-01 ENCOUNTER — Other Ambulatory Visit: Payer: Self-pay | Admitting: Pharmacist

## 2016-03-01 MED ORDER — GLIMEPIRIDE 2 MG PO TABS: 2.0000 mg | ORAL_TABLET | Freq: Every day | ORAL | Status: DC

## 2016-04-13 ENCOUNTER — Telehealth: Payer: Self-pay | Admitting: Nurse Practitioner

## 2016-04-13 NOTE — Telephone Encounter (Signed)
Why am I blocked that entire week?

## 2016-04-15 NOTE — Telephone Encounter (Signed)
Lm for patient to call back to schedule.

## 2016-04-18 ENCOUNTER — Other Ambulatory Visit: Payer: Self-pay | Admitting: Nurse Practitioner

## 2016-04-22 ENCOUNTER — Ambulatory Visit: Payer: Self-pay | Admitting: Nurse Practitioner

## 2016-05-14 ENCOUNTER — Ambulatory Visit: Payer: Self-pay | Admitting: Nurse Practitioner

## 2016-05-18 ENCOUNTER — Encounter: Payer: Self-pay | Admitting: Nurse Practitioner

## 2016-05-18 ENCOUNTER — Ambulatory Visit (INDEPENDENT_AMBULATORY_CARE_PROVIDER_SITE_OTHER): Payer: Self-pay | Admitting: Nurse Practitioner

## 2016-05-18 VITALS — BP 108/64 | HR 78 | Temp 97.9°F | Ht 62.0 in | Wt 109.0 lb

## 2016-05-18 DIAGNOSIS — E109 Type 1 diabetes mellitus without complications: Secondary | ICD-10-CM | POA: Insufficient documentation

## 2016-05-18 DIAGNOSIS — E1165 Type 2 diabetes mellitus with hyperglycemia: Secondary | ICD-10-CM

## 2016-05-18 LAB — BAYER DCA HB A1C WAIVED: HB A1C (BAYER DCA - WAIVED): 7.9 % — ABNORMAL HIGH (ref <7.0)

## 2016-05-18 MED ORDER — METFORMIN HCL 1000 MG PO TABS: 1000.0000 mg | ORAL_TABLET | Freq: Two times a day (BID) | ORAL | 5 refills | Status: DC

## 2016-05-18 MED ORDER — GLIMEPIRIDE 4 MG PO TABS: 4.0000 mg | ORAL_TABLET | Freq: Every day | ORAL | 5 refills | Status: DC

## 2016-05-18 NOTE — Progress Notes (Signed)
   Subjective:    Patient ID: Summer Spencer, female    DOB: 05/20/1991, 25 y.o.   MRN: [id number]  Diabetes  She presents for her follow-up diabetic visit. She has type 2 diabetes mellitus. No MedicAlert identification noted. Her disease course has been stable. There are no hypoglycemic associated symptoms. Pertinent negatives for diabetes include no polydipsia, no polyphagia, no polyuria and no weakness. There are no hypoglycemic complications. Symptoms are stable. There are no diabetic complications. She is compliant with treatment most of the time. Her weight is increasing steadily. She is following a diabetic diet. When asked about meal planning, she reported none. She has not had a previous visit with a dietitian. She rarely participates in exercise. Her home blood glucose trend is decreasing steadily. Her breakfast blood glucose range is generally >200 mg/dl. Her highest blood glucose is >200 mg/dl. Her overall blood glucose range is >200 mg/dl. An ACE inhibitor/angiotensin II receptor blocker is not being taken. She does not see a podiatrist.Eye exam is not current.      Review of Systems  Constitutional: Negative.   HENT: Negative.   Respiratory: Negative.   Cardiovascular: Negative.   Gastrointestinal: Negative.   Endocrine: Negative for polydipsia, polyphagia and polyuria.  Genitourinary: Negative.   Neurological: Negative.  Negative for weakness.  Psychiatric/Behavioral: Negative.   All other systems reviewed and are negative.      Objective:   Physical Exam  Constitutional: She is oriented to person, place, and time. She appears well-developed and well-nourished. No distress.  Cardiovascular: Normal rate, regular rhythm and normal heart sounds.   Pulmonary/Chest: Effort normal and breath sounds normal.  Neurological: She is alert and oriented to person, place, and time.  Skin: Skin is warm and dry.  Psychiatric: She has a normal mood and affect. Her behavior is normal.  Judgment and thought content normal.   BP 108/64   Pulse 78   Temp 97.9 F (36.6 C) (Oral)   Ht 5\' 2"  (1.575 m)   Wt 109 lb (49.4 kg)   BMI 19.94 kg/m   Hgba1c 7.9% down from 13.7 at last visit.      Assessment & Plan:  1. Type 2 diabetes mellitus with hyperglycemia, without long-term current use of insulin (HCC) Increased glimepiride to 4mg  daily Continue metformin 1000mg  BID Strict carb counting Follow up in 3 months  Mary-Margaret Daphine DeutscherMartin, FNP  - Bayer DCA Hb A1c Lindi AdieWaived

## 2016-08-17 ENCOUNTER — Encounter: Payer: Self-pay | Admitting: Nurse Practitioner

## 2016-08-17 ENCOUNTER — Telehealth: Payer: Self-pay | Admitting: Nurse Practitioner

## 2016-08-17 ENCOUNTER — Ambulatory Visit (INDEPENDENT_AMBULATORY_CARE_PROVIDER_SITE_OTHER): Payer: BLUE CROSS/BLUE SHIELD | Admitting: Nurse Practitioner

## 2016-08-17 VITALS — BP 103/64 | HR 74 | Temp 97.0°F | Ht 62.0 in | Wt 104.0 lb

## 2016-08-17 DIAGNOSIS — Z13220 Encounter for screening for lipoid disorders: Secondary | ICD-10-CM

## 2016-08-17 DIAGNOSIS — E1165 Type 2 diabetes mellitus with hyperglycemia: Secondary | ICD-10-CM | POA: Diagnosis not present

## 2016-08-17 LAB — BAYER DCA HB A1C WAIVED: HB A1C (BAYER DCA - WAIVED): 7.9 % — ABNORMAL HIGH (ref <7.0)

## 2016-08-17 MED ORDER — GLIMEPIRIDE 4 MG PO TABS: 4.0000 mg | ORAL_TABLET | Freq: Every day | ORAL | 5 refills | Status: DC

## 2016-08-17 MED ORDER — SITAGLIPTIN PHOS-METFORMIN HCL 50-1000 MG PO TABS: 1.0000 | ORAL_TABLET | Freq: Two times a day (BID) | ORAL | 5 refills | Status: DC

## 2016-08-17 MED ORDER — METFORMIN HCL 1000 MG PO TABS: 1000.0000 mg | ORAL_TABLET | Freq: Two times a day (BID) | ORAL | 5 refills | Status: DC

## 2016-08-17 NOTE — Progress Notes (Signed)
   Subjective:    Patient ID: Summer Spencer, female    DOB: May 02, 1991, 25 y.o.   MRN: [id number]  Patient here today for follow up of chronic medical problems.  Outpatient Encounter Prescriptions as of 08/17/2016  Medication Sig  . glimepiride (AMARYL) 4 MG tablet Take 1 tablet (4 mg total) by mouth daily with breakfast.  . metFORMIN (GLUCOPHAGE) 1000 MG tablet Take 1 tablet (1,000 mg total) by mouth 2 (two) times daily.  . Omega-3 Fatty Acids (FISH OIL PO) Take 1 capsule by mouth daily.  . Probiotic Product (PROBIOTIC PO) Take 1 tablet by mouth daily.   No facility-administered encounter medications on file as of 08/17/2016.     Diabetes  She presents for her follow-up diabetic visit. She has type 2 diabetes mellitus. No MedicAlert identification noted. Her disease course has been stable. There are no hypoglycemic associated symptoms. Pertinent negatives for diabetes include no polydipsia, no polyphagia, no polyuria and no weakness. There are no hypoglycemic complications. Symptoms are stable. There are no diabetic complications. She is compliant with treatment most of the time. Her weight is increasing steadily. She is following a diabetic diet. When asked about meal planning, she reported none. She has not had a previous visit with a dietitian. She rarely participates in exercise. Her home blood glucose trend is decreasing steadily. Her breakfast blood glucose range is generally >200 mg/dl. Her highest blood glucose is >200 mg/dl. Her overall blood glucose range is >200 mg/dl. An ACE inhibitor/angiotensin II receptor blocker is not being taken. She does not see a podiatrist.Eye exam is not current.      Review of Systems  Constitutional: Negative.   HENT: Negative.   Respiratory: Negative.   Cardiovascular: Negative.   Gastrointestinal: Negative.   Endocrine: Negative for polydipsia, polyphagia and polyuria.  Genitourinary: Negative.   Neurological: Negative.  Negative for  weakness.  Psychiatric/Behavioral: Negative.   All other systems reviewed and are negative.      Objective:   Physical Exam  Constitutional: She is oriented to person, place, and time. She appears well-developed and well-nourished. No distress.  Cardiovascular: Normal rate, regular rhythm and normal heart sounds.   Pulmonary/Chest: Effort normal and breath sounds normal.  Neurological: She is alert and oriented to person, place, and time.  Skin: Skin is warm and dry.  Psychiatric: She has a normal mood and affect. Her behavior is normal. Judgment and thought content normal.   BP 103/64   Pulse 74   Temp 97 F (36.1 C) (Oral)   Ht _0  (1.575 m)   Wt 104 lb (47.2 kg)   BMI 19.02 kg/m   hgba1c 7.9% no change from previous.   Assessment & Plan:  1. Type 2 diabetes mellitus with hyperglycemia, without long-term current use of insulin (HCC) Continue to watch carbs in diet stricter arb counting Keep diary of blood sugar fasating - Bayer DCA Hb A1c Waived - CMP14+EGFR - Microalbumin / creatinine urine ratio - glimepiride (AMARYL) 4 MG tablet; Take 1 tablet (4 mg total) by mouth daily with breakfast.  Dispense: 30 tablet; Refill: 5 - sitaGLIPtin-metformin (JANUMET) 50-1000 MG tablet; Take 1 tablet by mouth 2 (two) times daily with a meal.  Dispense: 60 tablet; Refill: 5  2. Screening for cholesterol level - Lipid panel    Labs pending Health maintenance reviewed Diet and exercise encouraged Continue all meds Follow up  In 3 months   [place], FNP

## 2016-08-17 NOTE — Telephone Encounter (Signed)
Informed pt to continue taking her glimepiride with the new Janumet but not to take the Metformin that she just had refilled, that her new medication had the metformin in it. Pt verbalizes understanding

## 2016-08-17 NOTE — Patient Instructions (Signed)
Carbohydrate Counting for Diabetes Mellitus, Adult Carbohydrate counting is a method for keeping track of how many carbohydrates you eat. Eating carbohydrates naturally increases the amount of sugar (glucose) in the blood. Counting how many carbohydrates you eat helps keep your blood glucose within normal limits, which helps you manage your diabetes (diabetes mellitus). It is important to know how many carbohydrates you can safely have in each meal. This is different for every person. A diet and nutrition specialist (registered dietitian) can help you make a meal plan and calculate how many carbohydrates you should have at each meal and snack. Carbohydrates are found in the following foods:  Grains, such as breads and cereals.  Dried beans and soy products.  Starchy vegetables, such as potatoes, peas, and corn.  Fruit and fruit juices.  Milk and yogurt.  Sweets and snack foods, such as cake, cookies, candy, chips, and soft drinks. How do I count carbohydrates? There are two ways to count carbohydrates in food. You can use either of the methods or a combination of both. Reading "Nutrition Facts" on packaged food  The "Nutrition Facts" list is included on the labels of almost all packaged foods and beverages in the [place] It includes:  The serving size.  Information about nutrients in each serving, including the grams (g) of carbohydrate per serving. To use the "Nutrition Facts":  Decide how many servings you will have.  Multiply the number of servings by the number of carbohydrates per serving.  The resulting number is the total amount of carbohydrates that you will be having. Learning standard serving sizes of other foods  When you eat foods containing carbohydrates that are not packaged or do not include "Nutrition Facts" on the label, you need to measure the servings in order to count the amount of carbohydrates:  Measure the foods that you will eat with a food scale or measuring  cup, if needed.  Decide how many standard-size servings you will eat.  Multiply the number of servings by 15. Most carbohydrate-rich foods have about 15 g of carbohydrates per serving.  For example, if you eat 8 oz (170 g) of strawberries, you will have eaten 2 servings and 30 g of carbohydrates (2 servings x 15 g = 30 g).  For foods that have more than one food mixed, such as soups and casseroles, you must count the carbohydrates in each food that is included. The following list contains standard serving sizes of common carbohydrate-rich foods. Each of these servings has about 15 g of carbohydrates:   hamburger bun or  English muffin.   oz (15 mL) syrup.   oz (14 g) jelly.  1 slice of bread.  1 six-inch tortilla.  3 oz (85 g) cooked rice or pasta.  4 oz (113 g) cooked dried beans.  4 oz (113 g) starchy vegetable, such as peas, corn, or potatoes.  4 oz (113 g) hot cereal.  4 oz (113 g) mashed potatoes or  of a large baked potato.  4 oz (113 g) canned or frozen fruit.  4 oz (120 mL) fruit juice.  4-6 crackers.  6 chicken nuggets.  6 oz (170 g) unsweetened dry cereal.  6 oz (170 g) plain fat-free yogurt or yogurt sweetened with artificial sweeteners.  8 oz (240 mL) milk.  8 oz (170 g) fresh fruit or one small piece of fruit.  24 oz (680 g) popped popcorn. Example of carbohydrate counting Sample meal  3 oz (85 g) chicken breast.  6 oz (  170 g) brown rice.  4 oz (113 g) corn.  8 oz (240 mL) milk.  8 oz (170 g) strawberries with sugar-free whipped topping. Carbohydrate calculation 1. Identify the foods that contain carbohydrates:  Rice.  Corn.  Milk.  Strawberries. 2. Calculate how many servings you have of each food:  2 servings rice.  1 serving corn.  1 serving milk.  1 serving strawberries. 3. Multiply each number of servings by 15 g:  2 servings rice x 15 g = 30 g.  1 serving corn x 15 g = 15 g.  1 serving milk x 15 g = 15  g.  1 serving strawberries x 15 g = 15 g. 4. Add together all of the amounts to find the total grams of carbohydrates eaten:  30 g + 15 g + 15 g + 15 g = 75 g of carbohydrates total. This information is not intended to replace advice given to you by your health care provider. Make sure you discuss any questions you have with your health care provider. Document Released: 08/16/2005 Document Revised: 03/05/2016 Document Reviewed: 01/28/2016 Elsevier Interactive Patient Education  2017 [employer].  

## 2016-08-18 LAB — CMP14+EGFR
ALT: 11 IU/L (ref 0–32)
AST: 10 IU/L (ref 0–40)
Albumin/Globulin Ratio: 2.5 — ABNORMAL HIGH (ref 1.2–2.2)
Albumin: 4.5 g/dL (ref 3.5–5.5)
Alkaline Phosphatase: 61 IU/L (ref 39–117)
BUN/Creatinine Ratio: 22 (ref 9–23)
BUN: 12 mg/dL (ref 6–20)
Bilirubin Total: 0.7 mg/dL (ref 0.0–1.2)
CO2: 24 mmol/L (ref 18–29)
Calcium: 9.4 mg/dL (ref 8.7–10.2)
Chloride: 99 mmol/L (ref 96–106)
Creatinine, Ser: 0.54 mg/dL — ABNORMAL LOW (ref 0.57–1.00)
GFR calc Af Amer: 150 mL/min/{1.73_m2} (ref >59)
GFR calc non Af Amer: 130 mL/min/{1.73_m2} (ref >59)
Globulin, Total: 1.8 g/dL (ref 1.5–4.5)
Glucose: 257 mg/dL — ABNORMAL HIGH (ref 65–99)
Potassium: 4.6 mmol/L (ref 3.5–5.2)
Sodium: 139 mmol/L (ref 134–144)
Total Protein: 6.3 g/dL (ref 6.0–8.5)

## 2016-08-18 LAB — MICROALBUMIN / CREATININE URINE RATIO
Creatinine, Urine: 134.7 mg/dL
Microalb/Creat Ratio: 3.9 mg/g creat (ref 0.0–30.0)
Microalbumin, Urine: 5.2 ug/mL

## 2016-08-18 LAB — LIPID PANEL
Chol/HDL Ratio: 2.4 ratio units (ref 0.0–4.4)
Cholesterol, Total: 125 mg/dL (ref 100–199)
HDL: 53 mg/dL (ref >39)
LDL Calculated: 62 mg/dL (ref 0–99)
Triglycerides: 49 mg/dL (ref 0–149)
VLDL Cholesterol Cal: 10 mg/dL (ref 5–40)

## 2016-08-19 ENCOUNTER — Telehealth: Payer: Self-pay

## 2016-08-19 MED ORDER — SAXAGLIPTIN-METFORMIN ER 5-1000 MG PO TB24: 1.0000 | ORAL_TABLET | Freq: Every day | ORAL | 3 refills | Status: DC

## 2016-08-19 NOTE — Telephone Encounter (Signed)
Pt notified of change in medication Verbalizes understanding 

## 2016-08-19 NOTE — Telephone Encounter (Signed)
Janumet changed to Campbell SoupKombiglyze per insurance

## 2016-08-24 ENCOUNTER — Telehealth: Payer: Self-pay | Admitting: Nurse Practitioner

## 2016-08-24 ENCOUNTER — Other Ambulatory Visit: Payer: Self-pay | Admitting: Nurse Practitioner

## 2016-08-24 MED ORDER — FLUCONAZOLE 150 MG PO TABS: 150.0000 mg | ORAL_TABLET | Freq: Once | ORAL | 0 refills | Status: DC

## 2016-08-24 NOTE — Progress Notes (Signed)
DIFLUCAN RX SENT TO PHARMACY 

## 2016-11-04 DIAGNOSIS — F411 Generalized anxiety disorder: Secondary | ICD-10-CM | POA: Diagnosis not present

## 2016-11-18 ENCOUNTER — Ambulatory Visit (INDEPENDENT_AMBULATORY_CARE_PROVIDER_SITE_OTHER): Payer: BLUE CROSS/BLUE SHIELD | Admitting: Nurse Practitioner

## 2016-11-18 VITALS — BP 120/79 | HR 94 | Temp 98.5°F | Ht 62.0 in | Wt 103.0 lb

## 2016-11-18 DIAGNOSIS — Z13220 Encounter for screening for lipoid disorders: Secondary | ICD-10-CM | POA: Diagnosis not present

## 2016-11-18 DIAGNOSIS — E1165 Type 2 diabetes mellitus with hyperglycemia: Secondary | ICD-10-CM

## 2016-11-18 LAB — BAYER DCA HB A1C WAIVED: HB A1C (BAYER DCA - WAIVED): 10.5 % — ABNORMAL HIGH (ref <7.0)

## 2016-11-18 MED ORDER — SAXAGLIPTIN-METFORMIN ER 2.5-1000 MG PO TB24: 2.0000 | ORAL_TABLET | Freq: Every day | ORAL | 5 refills | Status: DC

## 2016-11-18 NOTE — Patient Instructions (Signed)
Carbohydrate Counting for Diabetes Mellitus, Adult Carbohydrate counting is a method for keeping track of how many carbohydrates you eat. Eating carbohydrates naturally increases the amount of sugar (glucose) in the blood. Counting how many carbohydrates you eat helps keep your blood glucose within normal limits, which helps you manage your diabetes (diabetes mellitus). It is important to know how many carbohydrates you can safely have in each meal. This is different for every person. A diet and nutrition specialist (registered dietitian) can help you make a meal plan and calculate how many carbohydrates you should have at each meal and snack. Carbohydrates are found in the following foods:  Grains, such as breads and cereals.  Dried beans and soy products.  Starchy vegetables, such as potatoes, peas, and corn.  Fruit and fruit juices.  Milk and yogurt.  Sweets and snack foods, such as cake, cookies, candy, chips, and soft drinks. How do I count carbohydrates? There are two ways to count carbohydrates in food. You can use either of the methods or a combination of both. Reading "Nutrition Facts" on packaged food  The "Nutrition Facts" list is included on the labels of almost all packaged foods and beverages in the [place] It includes:  The serving size.  Information about nutrients in each serving, including the grams (g) of carbohydrate per serving. To use the "Nutrition Facts":  Decide how many servings you will have.  Multiply the number of servings by the number of carbohydrates per serving.  The resulting number is the total amount of carbohydrates that you will be having. Learning standard serving sizes of other foods  When you eat foods containing carbohydrates that are not packaged or do not include "Nutrition Facts" on the label, you need to measure the servings in order to count the amount of carbohydrates:  Measure the foods that you will eat with a food scale or measuring  cup, if needed.  Decide how many standard-size servings you will eat.  Multiply the number of servings by 15. Most carbohydrate-rich foods have about 15 g of carbohydrates per serving.  For example, if you eat 8 oz (170 g) of strawberries, you will have eaten 2 servings and 30 g of carbohydrates (2 servings x 15 g = 30 g).  For foods that have more than one food mixed, such as soups and casseroles, you must count the carbohydrates in each food that is included. The following list contains standard serving sizes of common carbohydrate-rich foods. Each of these servings has about 15 g of carbohydrates:   hamburger bun or  English muffin.   oz (15 mL) syrup.   oz (14 g) jelly.  1 slice of bread.  1 six-inch tortilla.  3 oz (85 g) cooked rice or pasta.  4 oz (113 g) cooked dried beans.  4 oz (113 g) starchy vegetable, such as peas, corn, or potatoes.  4 oz (113 g) hot cereal.  4 oz (113 g) mashed potatoes or  of a large baked potato.  4 oz (113 g) canned or frozen fruit.  4 oz (120 mL) fruit juice.  4-6 crackers.  6 chicken nuggets.  6 oz (170 g) unsweetened dry cereal.  6 oz (170 g) plain fat-free yogurt or yogurt sweetened with artificial sweeteners.  8 oz (240 mL) milk.  8 oz (170 g) fresh fruit or one small piece of fruit.  24 oz (680 g) popped popcorn. Example of carbohydrate counting Sample meal  3 oz (85 g) chicken breast.  6 oz (  170 g) brown rice.  4 oz (113 g) corn.  8 oz (240 mL) milk.  8 oz (170 g) strawberries with sugar-free whipped topping. Carbohydrate calculation 1. Identify the foods that contain carbohydrates:  Rice.  Corn.  Milk.  Strawberries. 2. Calculate how many servings you have of each food:  2 servings rice.  1 serving corn.  1 serving milk.  1 serving strawberries. 3. Multiply each number of servings by 15 g:  2 servings rice x 15 g = 30 g.  1 serving corn x 15 g = 15 g.  1 serving milk x 15 g = 15  g.  1 serving strawberries x 15 g = 15 g. 4. Add together all of the amounts to find the total grams of carbohydrates eaten:  30 g + 15 g + 15 g + 15 g = 75 g of carbohydrates total. This information is not intended to replace advice given to you by your health care provider. Make sure you discuss any questions you have with your health care provider. Document Released: 08/16/2005 Document Revised: 03/05/2016 Document Reviewed: 01/28/2016 Elsevier Interactive Patient Education  2017 [employer].  

## 2016-11-18 NOTE — Progress Notes (Signed)
   Subjective:    Patient ID: Summer Spencer, female    DOB: 10/28/1990, 28 y.o.   MRN: [id number]  Patient here today for follow up of chronic medical problems.  Outpatient Encounter Prescriptions as of 11/18/2016  Medication Sig  . glimepiride (AMARYL) 4 MG tablet Take 1 tablet (4 mg total) by mouth daily with breakfast.  . Omega-3 Fatty Acids (FISH OIL PO) Take 1 capsule by mouth daily.  . Probiotic Product (PROBIOTIC PO) Take 1 tablet by mouth daily.  . Saxagliptin-Metformin (KOMBIGLYZE XR) 12-998 MG TB24 Take 1 tablet by mouth daily.   No facility-administered encounter medications on file as of 11/18/2016.     Diabetes  She presents for her follow-up diabetic visit. She has type 2 diabetes mellitus. No MedicAlert identification noted. Her disease course has been stable. There are no hypoglycemic associated symptoms. Pertinent negatives for diabetes include no polydipsia, no polyphagia, no polyuria and no weakness. There are no hypoglycemic complications. Symptoms are stable. There are no diabetic complications. She is compliant with treatment most of the time. Her weight is increasing steadily. She is following a diabetic diet. When asked about meal planning, she reported none. She has not had a previous visit with a dietitian. She rarely participates in exercise. Her home blood glucose trend is decreasing steadily. Her breakfast blood glucose range is generally >200 mg/dl. Her highest blood glucose is >200 mg/dl. Her overall blood glucose range is >200 mg/dl. An ACE inhibitor/angiotensin II receptor blocker is not being taken. She does not see a podiatrist.Eye exam is not current.      Review of Systems  Constitutional: Negative.   HENT: Negative.   Respiratory: Negative.   Cardiovascular: Negative.   Gastrointestinal: Negative.   Endocrine: Negative for polydipsia, polyphagia and polyuria.  Genitourinary: Negative.   Neurological: Negative.  Negative for weakness.    Psychiatric/Behavioral: Negative.   All other systems reviewed and are negative.      Objective:   Physical Exam  Constitutional: She is oriented to person, place, and time. She appears well-developed and well-nourished. No distress.  Cardiovascular: Normal rate, regular rhythm and normal heart sounds.   Pulmonary/Chest: Effort normal and breath sounds normal.  Neurological: She is alert and oriented to person, place, and time.  Skin: Skin is warm and dry.  Psychiatric: She has a normal mood and affect. Her behavior is normal. Judgment and thought content normal.   BP 120/79   Pulse 94   Temp 98.5 F (36.9 C) (Oral)   Ht _0  (1.575 m)   Wt 103 lb (46.7 kg)   BMI 18.84 kg/m  hgba1c 10.5% up from 7.9% Last visit.   Assessment & Plan:   1. Type 2 diabetes mellitus with hyperglycemia, without long-term current use of insulin (HCC) Medication dosages changed to help decrease Ha1c. Will follow up in 1 month to reassess.  - Bayer DCA Hb A1c Waived - CMP14+EGFR - Saxagliptin-Metformin 2.12-998 MG TB24; Take 2 tablets by mouth daily.  Dispense: 60 tablet; Refill: 5  2. Screening for cholesterol level - Lipid panel   Labs pending Health maintenance reviewed Diet and exercise encouraged Begin new dosage of medication, follow up to recheck Ha1c in 1 month.   Dionisio David, FNP student [employer], FNP

## 2016-11-19 LAB — CMP14+EGFR
ALT: 18 IU/L (ref 0–32)
AST: 12 IU/L (ref 0–40)
Albumin/Globulin Ratio: 2.2 (ref 1.2–2.2)
Albumin: 4.7 g/dL (ref 3.5–5.5)
Alkaline Phosphatase: 52 IU/L (ref 39–117)
BUN/Creatinine Ratio: 16 (ref 9–23)
BUN: 10 mg/dL (ref 6–20)
Bilirubin Total: 0.5 mg/dL (ref 0.0–1.2)
CO2: 22 mmol/L (ref 18–29)
Calcium: 9.2 mg/dL (ref 8.7–10.2)
Chloride: 96 mmol/L (ref 96–106)
Creatinine, Ser: 0.61 mg/dL (ref 0.57–1.00)
GFR calc Af Amer: 144 mL/min/{1.73_m2} (ref >59)
GFR calc non Af Amer: 125 mL/min/{1.73_m2} (ref >59)
Globulin, Total: 2.1 g/dL (ref 1.5–4.5)
Glucose: 291 mg/dL — ABNORMAL HIGH (ref 65–99)
Potassium: 4.1 mmol/L (ref 3.5–5.2)
Sodium: 137 mmol/L (ref 134–144)
Total Protein: 6.8 g/dL (ref 6.0–8.5)

## 2016-11-19 LAB — LIPID PANEL
Chol/HDL Ratio: 3.2 ratio units (ref 0.0–4.4)
Cholesterol, Total: 155 mg/dL (ref 100–199)
HDL: 49 mg/dL (ref >39)
LDL Calculated: 96 mg/dL (ref 0–99)
Triglycerides: 48 mg/dL (ref 0–149)
VLDL Cholesterol Cal: 10 mg/dL (ref 5–40)

## 2016-11-28 ENCOUNTER — Other Ambulatory Visit: Payer: Self-pay | Admitting: Nurse Practitioner

## 2016-12-06 ENCOUNTER — Encounter: Payer: Self-pay | Admitting: Nurse Practitioner

## 2016-12-22 ENCOUNTER — Encounter: Payer: Self-pay | Admitting: Nurse Practitioner

## 2016-12-22 ENCOUNTER — Ambulatory Visit (INDEPENDENT_AMBULATORY_CARE_PROVIDER_SITE_OTHER): Payer: BLUE CROSS/BLUE SHIELD | Admitting: Nurse Practitioner

## 2016-12-22 VITALS — BP 95/69 | HR 84 | Temp 98.6°F | Ht 62.0 in | Wt 104.0 lb

## 2016-12-22 DIAGNOSIS — E1165 Type 2 diabetes mellitus with hyperglycemia: Secondary | ICD-10-CM

## 2016-12-22 LAB — BAYER DCA HB A1C WAIVED: HB A1C (BAYER DCA - WAIVED): 10.7 % — ABNORMAL HIGH (ref <7.0)

## 2016-12-22 MED ORDER — INSULIN PEN NEEDLE 32G X 4 MM MISC
1 refills | Status: DC
Start: 2016-12-22 — End: 2017-01-10

## 2016-12-22 MED ORDER — INSULIN GLARGINE 100 UNIT/ML SOLOSTAR PEN: 20.0000 [IU] | PEN_INJECTOR | Freq: Every day | SUBCUTANEOUS | 11 refills | Status: DC

## 2016-12-22 NOTE — Patient Instructions (Signed)
Insulin Glargine injection What is this medicine? INSULIN GLARGINE (IN su lin GLAR geen) is a human-made form of insulin. This drug lowers the amount of sugar in your blood. It is a long-acting insulin that is usually given once a day. This medicine may be used for other purposes; ask your health care provider or pharmacist if you have questions. COMMON BRAND NAME(S): BASAGLAR, Lantus, Lantus SoloStar, Toujeo SoloStar What should I tell my health care provider before I take this medicine? They need to know if you have any of these conditions: -episodes of low blood sugar -kidney disease -liver disease -an unusual or allergic reaction to insulin, metacresol, other medicines, foods, dyes, or preservatives -pregnant or trying to get pregnant -breast-feeding How should I use this medicine? This medicine is for injection under the skin. Use this medicine at the same time each day. Use exactly as directed. This insulin should never be mixed in the same syringe with other insulins before injection. Do not vigorously shake before use. You will be taught how to use this medicine and how to adjust doses for activities and illness. Do not use more insulin than prescribed. Always check the appearance of your insulin before using it. This medicine should be clear and colorless like water. Do not use it if it is cloudy, thickened, colored, or has solid particles in it. It is important that you put your used needles and syringes in a special sharps container. Do not put them in a trash can. If you do not have a sharps container, call your pharmacist or healthcare provider to get one. Talk to your pediatrician regarding the use of this medicine in children. Special care may be needed. Overdosage: If you think you have taken too much of this medicine contact a poison control center or emergency room at once. NOTE: This medicine is only for you. Do not share this medicine with others. What if I miss a dose? It is  important not to miss a dose. Your health care professional or doctor should discuss a plan for missed doses with you. If you do miss a dose, follow their plan. Do not take double doses. What may interact with this medicine? -other medicines for diabetes Many medications may cause changes in blood sugar, these include: -alcohol containing beverages -antiviral medicines for HIV or AIDS -aspirin and aspirin-like drugs -certain medicines for blood pressure, heart disease, irregular heart beat -chromium -diuretics -female hormones, such as estrogens or progestins, birth control pills -fenofibrate -gemfibrozil -isoniazid -lanreotide -female hormones or anabolic steroids -MAOIs like Carbex, Eldepryl, Marplan, Nardil, and Parnate -medicines for weight loss -medicines for allergies, asthma, cold, or cough -medicines for depression, anxiety, or psychotic disturbances -niacin -nicotine -NSAIDs, medicines for pain and inflammation, like ibuprofen or naproxen -octreotide -pasireotide -pentamidine -phenytoin -probenecid -quinolone antibiotics such as ciprofloxacin, levofloxacin, ofloxacin -some herbal dietary supplements -steroid medicines such as prednisone or cortisone -sulfamethoxazole; trimethoprim -thyroid hormones Some medications can hide the warning symptoms of low blood sugar (hypoglycemia). You may need to monitor your blood sugar more closely if you are taking one of these medications. These include: -beta-blockers, often used for high blood pressure or heart problems (examples include atenolol, metoprolol, propranolol) -clonidine -guanethidine -reserpine This list may not describe all possible interactions. Give your health care provider a list of all the medicines, herbs, non-prescription drugs, or dietary supplements you use. Also tell them if you smoke, drink alcohol, or use illegal drugs. Some items may interact with your medicine. What should I watch for   while using this  medicine? Visit your health care professional or doctor for regular checks on your progress. Do not drive, use machinery, or do anything that needs mental alertness until you know how this medicine affects you. Alcohol may interfere with the effect of this medicine. Avoid alcoholic drinks. A test called the HbA1C (A1C) will be monitored. This is a simple blood test. It measures your blood sugar control over the last 2 to 3 months. You will receive this test every 3 to 6 months. Learn how to check your blood sugar. Learn the symptoms of low and high blood sugar and how to manage them. Always carry a quick-source of sugar with you in case you have symptoms of low blood sugar. Examples include hard sugar candy or glucose tablets. Make sure others know that you can choke if you eat or drink when you develop serious symptoms of low blood sugar, such as seizures or unconsciousness. They must get medical help at once. Tell your doctor or health care professional if you have high blood sugar. You might need to change the dose of your medicine. If you are sick or exercising more than usual, you might need to change the dose of your medicine. Do not skip meals. Ask your doctor or health care professional if you should avoid alcohol. Many nonprescription cough and cold products contain sugar or alcohol. These can affect blood sugar. Make sure that you have the right kind of syringe for the type of insulin you use. Try not to change the brand and type of insulin or syringe unless your health care professional or doctor tells you to. Switching insulin brand or type can cause dangerously high or low blood sugar. Always keep an extra supply of insulin, syringes, and needles on hand. Use a syringe one time only. Throw away syringe and needle in a closed container to prevent accidental needle sticks. Insulin pens and cartridges should never be shared. Even if the needle is changed, sharing may result in passing of viruses  like hepatitis or HIV. Wear a medical ID bracelet or chain, and carry a card that describes your disease and details of your medicine and dosage times. What side effects may I notice from receiving this medicine? Side effects that you should report to your doctor or health care professional as soon as possible: -allergic reactions like skin rash, itching or hives, swelling of the face, lips, or tongue -breathing problems -signs and symptoms of high blood sugar such as dizziness, dry mouth, dry skin, fruity breath, nausea, stomach pain, increased hunger or thirst, increased urination -signs and symptoms of low blood sugar such as feeling anxious, confusion, dizziness, increased hunger, unusually weak or tired, sweating, shakiness, cold, irritable, headache, blurred vision, fast heartbeat, loss of consciousness Side effects that usually do not require medical attention (report to your doctor or health care professional if they continue or are bothersome): -increase or decrease in fatty tissue under the skin due to overuse of a particular injection site -itching, burning, swelling, or rash at site where injected This list may not describe all possible side effects. Call your doctor for medical advice about side effects. You may report side effects to FDA at 1-800-FDA-1088. Where should I keep my medicine? Keep out of the reach of children. Store unopened vials in a refrigerator between 2 and 8 degrees C (36 and 46 degrees F). Do not freeze or use if the insulin has been frozen. Opened vials (vials currently in use) may be stored   in the refrigerator or at room temperature, at approximately 25 degrees C (77 degrees F) or cooler. Keeping your insulin at room temperature decreases the amount of pain during injection. Once opened, your insulin can be used for 28 days. After 28 days, the vial should be thrown away. Store Lantus Solostar Pens or Basaglar KwikPens in a refrigerator between 2 and 8 degrees C (36  and 46 degrees F) or at room temperature below 30 degrees C (86 degrees F). Do not freeze or use if the insulin has been frozen. Once opened, the pens should be kept at room temperature. Do not store in the refrigerator once opened. Once opened, the insulin can be used for 28 days. After 28 days, the Lantus Solostar Pen or Basaglar KwikPen should be thrown away. Store Toujeo Solostar Pens in a refrigerator between 2 and 8 degrees C (36 and 46 degrees F). Do not freeze or use if the insulin has been frozen. Once opened, the pens should be kept at room temperature below 30 degrees C (86 degrees F). Do not store in the refrigerator once opened. Once opened, the insulin can be used for 42 days. After 42 days, the Toujeo Solostar Pen should be thrown away. Protect from light and excessive heat. Throw away any unused medicine after the expiration date or after the specified time for room temperature storage has passed. NOTE: This sheet is a summary. It may not cover all possible information. If you have questions about this medicine, talk to your doctor, pharmacist, or health care provider.  2018 Elsevier/Gold Standard (2016-09-01 10:26:25)  

## 2016-12-22 NOTE — Progress Notes (Signed)
   Subjective:    Patient ID: Summer Spencer, female    DOB: 08-Jul-1991, 26 y.o.   MRN: [id number]  HPI  Patient comes in today for diabetic recheck. She was seen on 11/18/16 with elevated blood sugars. Her Hgba1c at that time was 10.5%. We increased onglyza/metformin dose to 2.12/998 2 tablets a day. Her blood sugars have been consistently running in the 200-300. She says that she really does watch her diet.   Review of Systems  Constitutional: Negative.   Respiratory: Negative.   Cardiovascular: Negative.   Gastrointestinal: Negative.   Endocrine: Positive for polydipsia, polyphagia and polyuria.  Neurological: Negative.   Psychiatric/Behavioral: Negative.   All other systems reviewed and are negative.      Objective:   Physical Exam  Constitutional: She is oriented to person, place, and time. She appears well-developed and well-nourished. No distress.  Cardiovascular: Normal rate and regular rhythm.   Pulmonary/Chest: Effort normal and breath sounds normal.  Neurological: She is alert and oriented to person, place, and time.  Skin: Skin is warm.  Psychiatric: She has a normal mood and affect. Her behavior is normal. Judgment and thought content normal.   BP 95/69   Pulse 84   Temp 98.6 F (37 C) (Oral)   Ht  (1.575 m)   Wt 104 lb (47.2 kg)   BMI 19.02 kg/m   hgba1c 10.7      Assessment & Plan:  1. Type 2 diabetes mellitus with hyperglycemia, without long-term current use of insulin (HCC) Patient is to do strict carb counting until can see clinical pharmacist Started on lantus 20u nightly- demo on pen use Continue all other meds until sees clinical pharmacist Meds ordered this encounter  Medications  . Insulin Glargine (LANTUS) 100 UNIT/ML Solostar Pen    Sig: Inject 20 Units into the skin daily at 10 pm.    Dispense:  15 mL    Refill:  11    Order Specific Question:   Supervising Provider    Answer:   Oswaldo Done, CAROL L [[id number]]  . Insulin Pen Needle (BD PEN  NEEDLE NANO U/F) 32G X 4 MM MISC    Sig: Use with solostar pen daily  E11.65    Dispense:  100 each    Refill:  1    Order Specific Question:   Supervising Provider    Answer:   VINCENT, CAROL L [[id number]]    - Bayer DCA Hb A1c Waived  Mary-Margaret Daphine Deutscher, FNP

## 2016-12-27 ENCOUNTER — Telehealth: Payer: Self-pay

## 2016-12-27 DIAGNOSIS — E1165 Type 2 diabetes mellitus with hyperglycemia: Secondary | ICD-10-CM

## 2016-12-27 NOTE — Telephone Encounter (Signed)
referral made.

## 2017-01-03 ENCOUNTER — Ambulatory Visit: Payer: BLUE CROSS/BLUE SHIELD | Admitting: Pharmacist

## 2017-01-10 ENCOUNTER — Encounter: Payer: Self-pay | Admitting: Family

## 2017-01-10 ENCOUNTER — Ambulatory Visit (INDEPENDENT_AMBULATORY_CARE_PROVIDER_SITE_OTHER): Payer: BLUE CROSS/BLUE SHIELD | Admitting: Family

## 2017-01-10 VITALS — BP 103/67 | HR 83 | Ht 62.0 in | Wt 102.2 lb

## 2017-01-10 DIAGNOSIS — L509 Urticaria, unspecified: Secondary | ICD-10-CM

## 2017-01-10 NOTE — Patient Instructions (Signed)
Hives Hives (urticaria) are itchy, red, swollen areas on your skin. Hives can appear on any part of your body and can vary in size. They can be as small as the tip of a pen or much larger. Hives often fade within 24 hours (acute hives). In other cases, new hives appear after old ones fade. This cycle can continue for several days or weeks (chronic hives). Hives result from your body's reaction to an irritant or to something that you are allergic to (trigger). When you are exposed to a trigger, your body releases a chemical (histamine) that causes redness, itching, and swelling. You can get hives immediately after being exposed to a trigger or hours later. Hives do not spread from person to person (are not contagious). Your hives may get worse with scratching, exercise, and emotional stress. What are the causes? Causes of this condition include:  Allergies to certain foods or ingredients.  Insect bites or stings.  Exposure to pollen or pet dander.  Contact with latex or chemicals.  Spending time in sunlight, heat, or cold (exposure).  Exercise.  Stress. You can also get hives from some medical conditions and treatments. These include:  Viruses, including the common cold.  Bacterial infections, such as urinary tract infections and strep throat.  Disorders such as vasculitis, lupus, or thyroid disease.  Certain medications.  Allergy shots.  Blood transfusions. Sometimes, the cause of hives is not known (idiopathic hives). What increases the risk? This condition is more likely to develop in:  Women.  People who have food allergies, especially to citrus fruits, milk, eggs, peanuts, tree nuts, or shellfish.  People who are allergic to:  Medicines.  Latex.  Insects.  Animals.  Pollen.  People who have certain medical conditions, includinglupus or thyroid disease. What are the signs or symptoms? The main symptom of this condition is raised, itchyred or white bumps or  patches on your skin. These areas may:  Become large and swollen (welts).  Change in shape and location, quickly and repeatedly.  Be separate hives or connect over a large area of skin.  Sting or become painful.  Turn white when pressed in the center (blanch). In severe cases, yourhands, feet, and face may also become swollen. This may occur if hives develop deeper in your skin. How is this diagnosed? This condition is diagnosed based on your symptoms, medical history, and physical exam. Your skin, urine, or blood may be tested to find out what is causing your hives and to rule out other health issues. Your health care provider may also remove a small sample of skin from the affected area and examine it under a microscope (biopsy). How is this treated? Treatment depends on the severity of your condition. Your health care provider may recommend using cool, wet cloths (cool compresses) or taking cool showers to relieve itching. Hives are sometimes treated with medicines, including:  Antihistamines.  Corticosteroids.  Antibiotics.  An injectable medicine (omalizumab). Your health care provider may prescribe this if you have chronic idiopathic hives and you continue to have symptoms even after treatment with antihistamines. Severe cases may require an emergency injection of adrenaline (epinephrine) to prevent a life-threatening allergic reaction (anaphylaxis). Follow these instructions at home: Medicines  Take or apply over-the-counter and prescription medicines only as told by your health care provider.  If you were prescribed an antibiotic medicine, use it as told by your health care provider. Do not stop taking the antibiotic even if you start to feel better. Skin Care    Apply cool compresses to the affected areas.  Do not scratch or rub your skin. General instructions  Do not take hot showers or baths. This can make itching worse.  Do not wear tight-fitting clothing.  Use  sunscreen and wear protective clothing when you are outside.  Avoid any substances that cause your hives. Keep a journal to help you track what causes your hives. Write down:  What medicines you take.  What you eat and drink.  What products you use on your skin.  Keep all follow-up visits as told by your health care provider. This is important. Contact a health care provider if:  Your symptoms are not controlled with medicine.  Your joints are painful or swollen. Get help right away if:  You have a fever.  You have pain in your abdomen.  Your tongue or lips are swollen.  Your eyelids are swollen.  Your chest or throat feels tight.  You have trouble breathing or swallowing. These symptoms may represent a serious problem that is an emergency. Do not wait to see if the symptoms will go away. Get medical help right away. Call your local emergency services (911 in the [place] Do not drive yourself to the hospital.  This information is not intended to replace advice given to you by your health care provider. Make sure you discuss any questions you have with your health care provider. Document Released: 08/16/2005 Document Revised: 01/14/2016 Document Reviewed: 06/04/2015 Elsevier Interactive Patient Education  2017 Elsevier Inc.  

## 2017-01-10 NOTE — Progress Notes (Signed)
   Subjective:    Patient ID: Summer Spencer, female    DOB: 09/16/1990, 26 y.o.   MRN: [id number]  Rash  This is a new problem. The current episode started today. The problem is unchanged. The affected locations include the back. The rash is characterized by redness. She was exposed to nothing (but has been outside all weekend). Pertinent negatives include no congestion, cough, eye pain, fatigue, joint pain or shortness of breath. Past treatments include nothing. The treatment provided no relief.      Review of Systems  Constitutional: Negative for fatigue.  HENT: Negative for congestion.   Eyes: Negative for pain.  Respiratory: Negative for cough and shortness of breath.   Musculoskeletal: Negative for joint pain.  Skin: Positive for rash.  All other systems reviewed and are negative.      Objective:   Physical Exam  Constitutional: She is oriented to person, place, and time. She appears well-developed and well-nourished. No distress.  HENT:  Head: Normocephalic and atraumatic.  Right Ear: External ear normal.  Left Ear: External ear normal.  Nose: Nose normal.  Mouth/Throat: Oropharynx is clear and moist.  Eyes: Pupils are equal, round, and reactive to light.  Neck: Normal range of motion. Neck supple. No thyromegaly present.  Cardiovascular: Normal rate, regular rhythm, normal heart sounds and intact distal pulses.   No murmur heard. Pulmonary/Chest: Effort normal and breath sounds normal. No respiratory distress. She has no wheezes.  Abdominal: Soft. Bowel sounds are normal. She exhibits no distension. There is no tenderness.  Musculoskeletal: Normal range of motion. She exhibits no edema or tenderness.  Neurological: She is alert and oriented to person, place, and time. She has normal reflexes. No cranial nerve deficit.  Skin: Skin is warm and dry. Rash noted. Rash is urticarial (scattered on back).  Psychiatric: She has a normal mood and affect. Her behavior is normal.  Judgment and thought content normal.  Vitals reviewed.   BP 103/67   Pulse 83   Ht 5\' 2"  (1.575 m)   Wt 102 lb 3.2 oz (46.4 kg)   BMI 18.69 kg/m       Assessment & Plan:  1. Urticaria -Start benadryl This is the first time this has occurred, if it occurs again discussed doing journal to see what she has ate, drank, or been exposed to Do not scratch Daily Zyrtec RTO prn    Jannifer Rodneyhristy Marykay Mccleod, FNP

## 2017-01-12 ENCOUNTER — Telehealth: Payer: Self-pay | Admitting: Nurse Practitioner

## 2017-01-12 NOTE — Telephone Encounter (Signed)
Faxed a copy of labs to Dr Altheimer at [phone] and also put a copy in the mail to the dr

## 2017-01-28 ENCOUNTER — Ambulatory Visit (INDEPENDENT_AMBULATORY_CARE_PROVIDER_SITE_OTHER): Payer: BLUE CROSS/BLUE SHIELD | Admitting: Family Medicine

## 2017-01-28 ENCOUNTER — Encounter: Payer: Self-pay | Admitting: Family Medicine

## 2017-01-28 ENCOUNTER — Ambulatory Visit (HOSPITAL_COMMUNITY)
Admission: RE | Admit: 2017-01-28 | Discharge: 2017-01-28 | Disposition: A | Discharge status: Home / Self Care | Payer: BLUE CROSS/BLUE SHIELD | Source: Ambulatory Visit | Attending: Family Medicine | Admitting: Family Medicine

## 2017-01-28 VITALS — BP 107/70 | HR 80 | Temp 97.4°F | Ht 62.0 in | Wt 105.0 lb

## 2017-01-28 DIAGNOSIS — N760 Acute vaginitis: Secondary | ICD-10-CM | POA: Diagnosis not present

## 2017-01-28 DIAGNOSIS — R1011 Right upper quadrant pain: Secondary | ICD-10-CM

## 2017-01-28 DIAGNOSIS — B354 Tinea corporis: Secondary | ICD-10-CM

## 2017-01-28 DIAGNOSIS — B9689 Other specified bacterial agents as the cause of diseases classified elsewhere: Secondary | ICD-10-CM

## 2017-01-28 LAB — WET PREP FOR TRICH, YEAST, CLUE
Clue Cell Exam: POSITIVE — AB
Trichomonas Exam: NEGATIVE
Yeast Exam: NEGATIVE

## 2017-01-28 MED ORDER — METRONIDAZOLE 500 MG PO TABS: 500.0000 mg | ORAL_TABLET | Freq: Two times a day (BID) | ORAL | 0 refills | Status: DC

## 2017-01-28 MED ORDER — NYSTATIN-TRIAMCINOLONE 100000-0.1 UNIT/GM-% EX OINT: 1.0000 "application " | TOPICAL_OINTMENT | Freq: Two times a day (BID) | CUTANEOUS | 0 refills | Status: DC

## 2017-01-28 NOTE — Progress Notes (Signed)
BP 107/70   Pulse 80   Temp 97.4 F (36.3 C) (Oral)   Ht 5\' 2"  (1.575 m)   Wt 105 lb (47.6 kg)   LMP 01/06/2009   BMI 19.20 kg/m    Subjective:    Patient ID: Summer Spencer, female    DOB: 01/04/1991, 26 y.o.   MRN: [id number]  HPI: Summer Spencer is a 26 y.o. female presenting on 01/28/2017 for Rash (on back, was seen 2 weeks ago, rash is no longer raised by still there); Vaginal Discharge (has taken 2 Diflucan, still having discharge and itching); and Chest Pain (right side x 1 month, sporadic episodes)   HPI Vaginal discharge Patient has been having problems with vaginal discharge and irritation. The irritation and discharge is been going on for the past week. She has done 2 doses of Diflucan and does not seem to be helping. She has one sexual partner and no other partners. She denies any fevers or lower abdominal pain or dysuria or diarrhea or constipation.  Rash on back Pruritic rash on her back that started 2 weeks ago and was raised initially but is no longer raised. She has never had a rash like this previously. She denies any fevers or chills or shortness of breath or wheezing. She denies any redness or warmth or drainage. The rash covers most of her back in small spots or patches. The rash is not anywhere else.  Upper right abdominal pain Patient has been having upper right abdominal pain that's been going on for the past 1 month and comes in sporadic episodes. She does not notice if it comes with or without eating. She has had a little bit of nausea associated with it but denies any blood in her stool. She denies any fevers or chills or dysuria.  Relevant past medical, surgical, family and social history reviewed and updated as indicated. Interim medical history since our last visit reviewed. Allergies and medications reviewed and updated.  Review of Systems  Constitutional: Negative for chills and fever.  Respiratory: Negative for chest tightness and shortness of  breath.   Cardiovascular: Negative for chest pain and leg swelling.  Gastrointestinal: Positive for abdominal pain and nausea. Negative for abdominal distention, anal bleeding, blood in stool, constipation, diarrhea and vomiting.  Genitourinary: Positive for vaginal discharge and vaginal pain. Negative for decreased urine volume, difficulty urinating, dysuria, genital sores, menstrual problem, urgency and vaginal bleeding.  Musculoskeletal: Negative for back pain and gait problem.  Skin: Positive for rash.  Neurological: Negative for light-headedness and headaches.  Psychiatric/Behavioral: Negative for agitation and behavioral problems.  All other systems reviewed and are negative.   Per HPI unless specifically indicated above        Objective:    BP 107/70   Pulse 80   Temp 97.4 F (36.3 C) (Oral)   Ht 5\' 2"  (1.575 m)   Wt 105 lb (47.6 kg)   LMP 01/06/2009   BMI 19.20 kg/m   Wt Readings from Last 3 Encounters:  01/28/17 105 lb (47.6 kg)  01/10/17 102 lb 3.2 oz (46.4 kg)  12/22/16 104 lb (47.2 kg)    Physical Exam  Constitutional: She is oriented to person, place, and time. She appears well-developed and well-nourished. No distress.  Eyes: Conjunctivae are normal.  Cardiovascular: Normal rate, regular rhythm, normal heart sounds and intact distal pulses.   No murmur heard. Pulmonary/Chest: Effort normal and breath sounds normal. No respiratory distress. She has no wheezes. She has no  rales.  Abdominal: Soft. Bowel sounds are normal. She exhibits no distension. There is no hepatosplenomegaly. There is tenderness in the right upper quadrant. There is no rigidity, no rebound, no guarding and no CVA tenderness.  Genitourinary: Uterus normal. There is no rash, tenderness or lesion on the right labia. There is no rash, tenderness or lesion on the left labia. Cervix exhibits discharge. Cervix exhibits no motion tenderness and no friability. Right adnexum displays no mass, no  tenderness and no fullness. Left adnexum displays no mass, no tenderness and no fullness. No erythema, tenderness or bleeding in the vagina. Vaginal discharge found.  Musculoskeletal: Normal range of motion. She exhibits no edema or tenderness.  Neurological: She is alert and oriented to person, place, and time. Coordination normal.  Skin: Skin is warm and dry. Rash (Macular rash with central clearing and crusting around the edges, consistent with tinea corporis) noted. She is not diaphoretic.  Psychiatric: She has a normal mood and affect. Her behavior is normal.  Nursing note and vitals reviewed.   Wet prep: Positive for clue cells    Assessment & Plan:   Problem List Items Addressed This Visit    None    Visit Diagnoses    Bacterial vaginosis    -  Primary   Relevant Medications   nystatin-triamcinolone ointment (MYCOLOG)   metroNIDAZOLE (FLAGYL) 500 MG tablet   Other Relevant Orders   WET PREP FOR TRICH, YEAST, CLUE (Completed)   Right upper quadrant pain       Relevant Medications   nystatin-triamcinolone ointment (MYCOLOG)   Tinea corporis       Relevant Medications   nystatin-triamcinolone ointment (MYCOLOG)   metroNIDAZOLE (FLAGYL) 500 MG tablet   Other Relevant Orders   US Abdomen Limited RUQ (Completed)       Follow up plan: Return if symptoms worsen or fail to improve.  Counseling provided for all of the vaccine components Orders Placed This Encounter  Procedures  . US Abdomen Limited RUQ    Arville CareJoshua Dettinger, MD [facility] 01/28/2017, 11:06 AM

## 2017-02-03 ENCOUNTER — Telehealth: Payer: Self-pay | Admitting: Family Medicine

## 2017-02-03 MED ORDER — FLUCONAZOLE 150 MG PO TABS: 150.0000 mg | ORAL_TABLET | Freq: Once | ORAL | 0 refills | Status: AC

## 2017-02-03 NOTE — Telephone Encounter (Signed)
Patient aware and diflucan sent to pharmacy.

## 2017-02-03 NOTE — Telephone Encounter (Signed)
Pt is still having vaginal d/c with itching Wants refill on medication (Flagyl) Please advise

## 2017-02-15 ENCOUNTER — Telehealth: Payer: Self-pay | Admitting: Nurse Practitioner

## 2017-02-15 MED ORDER — KETOCONAZOLE 2 % EX CREA: 1.0000 "application " | TOPICAL_CREAM | Freq: Every day | CUTANEOUS | 0 refills | Status: DC

## 2017-02-15 MED ORDER — METRONIDAZOLE 0.75 % VA GEL: 1.0000 | Freq: Two times a day (BID) | VAGINAL | 0 refills | Status: DC

## 2017-02-15 NOTE — Telephone Encounter (Signed)
Seen Dettinger 2 weeks ago and was treated for BV and yeast.  Patient symptoms are no better and would like something else to be sent to pharmacy

## 2017-02-15 NOTE — Telephone Encounter (Signed)
Covering for PCP  Second attempt to treat BV ( change to metrogel), and tinea corporis ( change to ketoconazole).   If no improvement would recommend follow up appt.   Murtis SinkSam Azlaan Isidore, MD [facility] 02/15/2017, 10:22 AM

## 2017-02-15 NOTE — Telephone Encounter (Signed)
What symptoms do you have? Bv, she wants an antibiotic  How long have you been sick? Two weeks   Have you been seen for this problem? yes  If your provider decides to give you a prescription, which pharmacy would you like for it to be sent to? [facility] in [place].    Patient informed that this information will be sent to the clinical staff for review and that they should receive a follow up call.

## 2017-02-15 NOTE — Telephone Encounter (Signed)
Left message stating that medication has been sent to pharmacy and if not any better after this then she will need to schedule a follow up appt with Dr. Louanne Skyeettinger

## 2017-02-15 NOTE — Addendum Note (Signed)
Addended by: Elenora GammaBRADSHAW, SAMUEL L on: 02/15/2017 10:23 AM   Modules accepted: Orders

## 2017-02-17 ENCOUNTER — Other Ambulatory Visit: Payer: Self-pay

## 2017-02-17 ENCOUNTER — Telehealth: Payer: Self-pay | Admitting: Nurse Practitioner

## 2017-02-17 MED ORDER — KETOCONAZOLE 2 % EX CREA: 1.0000 "application " | TOPICAL_CREAM | Freq: Every day | CUTANEOUS | 0 refills | Status: DC

## 2017-02-17 MED ORDER — METRONIDAZOLE 0.75 % VA GEL: 1.0000 | Freq: Two times a day (BID) | VAGINAL | 0 refills | Status: DC

## 2017-02-17 NOTE — Telephone Encounter (Signed)
[facility] per patients request

## 2017-02-22 ENCOUNTER — Encounter: Payer: Self-pay | Admitting: Nurse Practitioner

## 2017-02-22 ENCOUNTER — Other Ambulatory Visit: Payer: Self-pay | Admitting: Nurse Practitioner

## 2017-02-22 DIAGNOSIS — N760 Acute vaginitis: Secondary | ICD-10-CM

## 2017-02-22 DIAGNOSIS — B9689 Other specified bacterial agents as the cause of diseases classified elsewhere: Secondary | ICD-10-CM

## 2017-02-22 MED ORDER — METRONIDAZOLE 500 MG PO TABS: 500.0000 mg | ORAL_TABLET | Freq: Two times a day (BID) | ORAL | 0 refills | Status: DC

## 2017-02-22 NOTE — Progress Notes (Signed)
Flagyl called in.

## 2017-02-24 ENCOUNTER — Ambulatory Visit: Payer: BLUE CROSS/BLUE SHIELD | Admitting: Nurse Practitioner

## 2017-03-01 DIAGNOSIS — Z833 Family history of diabetes mellitus: Secondary | ICD-10-CM | POA: Diagnosis not present

## 2017-03-01 DIAGNOSIS — E1165 Type 2 diabetes mellitus with hyperglycemia: Secondary | ICD-10-CM | POA: Diagnosis not present

## 2017-03-01 DIAGNOSIS — E1065 Type 1 diabetes mellitus with hyperglycemia: Secondary | ICD-10-CM | POA: Insufficient documentation

## 2017-03-01 DIAGNOSIS — Z794 Long term (current) use of insulin: Secondary | ICD-10-CM | POA: Diagnosis not present

## 2017-03-04 ENCOUNTER — Encounter: Payer: Self-pay | Admitting: Nurse Practitioner

## 2017-03-04 ENCOUNTER — Other Ambulatory Visit: Payer: Self-pay | Admitting: Nurse Practitioner

## 2017-03-04 MED ORDER — FLUCONAZOLE 150 MG PO TABS: 150.0000 mg | ORAL_TABLET | Freq: Once | ORAL | 0 refills | Status: AC

## 2017-03-08 DIAGNOSIS — Z794 Long term (current) use of insulin: Secondary | ICD-10-CM | POA: Diagnosis not present

## 2017-03-08 DIAGNOSIS — E1065 Type 1 diabetes mellitus with hyperglycemia: Secondary | ICD-10-CM | POA: Diagnosis not present

## 2017-03-11 ENCOUNTER — Encounter: Payer: Self-pay | Admitting: Nurse Practitioner

## 2017-03-15 DIAGNOSIS — E1065 Type 1 diabetes mellitus with hyperglycemia: Secondary | ICD-10-CM | POA: Diagnosis not present

## 2017-03-15 DIAGNOSIS — Z794 Long term (current) use of insulin: Secondary | ICD-10-CM | POA: Diagnosis not present

## 2017-03-17 ENCOUNTER — Encounter: Payer: BLUE CROSS/BLUE SHIELD | Admitting: Nurse Practitioner

## 2017-03-25 ENCOUNTER — Encounter: Payer: Self-pay | Admitting: Nurse Practitioner

## 2017-03-25 ENCOUNTER — Ambulatory Visit (INDEPENDENT_AMBULATORY_CARE_PROVIDER_SITE_OTHER): Payer: BLUE CROSS/BLUE SHIELD | Admitting: Nurse Practitioner

## 2017-03-25 VITALS — BP 110/68 | HR 73 | Temp 97.6°F | Ht 62.0 in | Wt 116.0 lb

## 2017-03-25 DIAGNOSIS — Z0000 Encounter for general adult medical examination without abnormal findings: Secondary | ICD-10-CM | POA: Diagnosis not present

## 2017-03-25 NOTE — Progress Notes (Signed)
   Subjective:    Patient ID: Summer Spencer, female    DOB: 08/07/1991, 26 y.o.   MRN: [id number]  HPI Patient comes in today for annual physical exam and PAP. She is a diabetic and see endocrinology. Last appointment was 03/15/17 hgba1c was 11.8% . She was put on sliding scale to take with meals. She says she is doing well and blood sugars are down some.    Review of Systems  Constitutional: Negative for activity change and appetite change.  HENT: Negative.   Eyes: Negative for pain.  Respiratory: Negative for shortness of breath.   Cardiovascular: Negative for chest pain, palpitations and leg swelling.  Gastrointestinal: Negative for abdominal pain.  Endocrine: Negative for polydipsia.  Genitourinary: Negative.   Skin: Negative for rash.  Neurological: Negative for dizziness, weakness and headaches.  Hematological: Does not bruise/bleed easily.  Psychiatric/Behavioral: Negative.   All other systems reviewed and are negative.      Objective:   Physical Exam  Constitutional: She is oriented to person, place, and time. She appears well-developed and well-nourished.  HENT:  Head: Normocephalic.  Right Ear: Hearing, tympanic membrane, external ear and ear canal normal.  Left Ear: Hearing, tympanic membrane, external ear and ear canal normal.  Nose: Nose normal.  Mouth/Throat: Uvula is midline and oropharynx is clear and moist.  Eyes: Pupils are equal, round, and reactive to light. Conjunctivae and EOM are normal.  Neck: Trachea normal, normal range of motion and full passive range of motion without pain. Neck supple. No JVD present. Carotid bruit is not present. No thyroid mass and no thyromegaly present.  Cardiovascular: Normal rate, regular rhythm, normal heart sounds and intact distal pulses.  Exam reveals no gallop and no friction rub.   No murmur heard. Pulmonary/Chest: Effort normal and breath sounds normal. Right breast exhibits no inverted nipple, no mass, no nipple  discharge, no skin change and no tenderness. Left breast exhibits no inverted nipple, no mass, no nipple discharge, no skin change and no tenderness.  Abdominal: Soft. Bowel sounds are normal. She exhibits no distension and no mass. There is no tenderness.  Genitourinary: Vagina normal and uterus normal. No breast swelling, tenderness, discharge or bleeding. No vaginal discharge found.  Genitourinary Comments: bimanual exam-No adnexal masses or tenderness. No adnexal masses or tenderness  Musculoskeletal: Normal range of motion.  Lymphadenopathy:    She has no cervical adenopathy.  Neurological: She is alert and oriented to person, place, and time. She has normal reflexes.  Skin: Skin is warm and dry.  Psychiatric: She has a normal mood and affect. Her behavior is normal. Judgment and thought content normal.   BP 110/68   Pulse 73   Temp 97.6 F (36.4 C) (Oral)   Ht 5\' 2"  (1.575 m)   Wt 116 lb (52.6 kg)   BMI 21.22 kg/m       Assessment & Plan:  1. Annual physical exam Keep follow up appointments with endocrinologist - Urinalysis, Complete    Labs pending Health maintenance reviewed Diet and exercise encouraged Continue all meds Follow up  In 1 year   Mary-Margaret Daphine DeutscherMartin, FNP

## 2017-03-25 NOTE — Patient Instructions (Signed)

## 2017-03-28 LAB — URINALYSIS, COMPLETE
Bilirubin, UA: NEGATIVE
Ketones, UA: NEGATIVE
Leukocytes, UA: NEGATIVE
Nitrite, UA: NEGATIVE
Protein, UA: NEGATIVE
RBC, UA: NEGATIVE
Specific Gravity, UA: 1.02 (ref 1.005–1.030)
Urobilinogen, Ur: 0.2 mg/dL (ref 0.2–1.0)
pH, UA: 5.5 (ref 5.0–7.5)

## 2017-03-28 LAB — MICROSCOPIC EXAMINATION
RBC, UA: NONE SEEN /hpf (ref 0–?)
Renal Epithel, UA: NONE SEEN /hpf
WBC, UA: NONE SEEN /hpf (ref 0–?)

## 2017-03-28 LAB — IGP, RFX APTIMA HPV ASCU: PAP Smear Comment: 0

## 2017-03-29 DIAGNOSIS — Z794 Long term (current) use of insulin: Secondary | ICD-10-CM | POA: Diagnosis not present

## 2017-03-29 DIAGNOSIS — Z833 Family history of diabetes mellitus: Secondary | ICD-10-CM | POA: Diagnosis not present

## 2017-03-29 DIAGNOSIS — Z720 Tobacco use: Secondary | ICD-10-CM | POA: Diagnosis not present

## 2017-03-29 DIAGNOSIS — E1065 Type 1 diabetes mellitus with hyperglycemia: Secondary | ICD-10-CM | POA: Diagnosis not present

## 2017-05-25 ENCOUNTER — Ambulatory Visit (INDEPENDENT_AMBULATORY_CARE_PROVIDER_SITE_OTHER): Payer: Self-pay | Admitting: Family Medicine

## 2017-05-25 ENCOUNTER — Encounter: Payer: Self-pay | Admitting: Family Medicine

## 2017-05-25 VITALS — BP 109/69 | HR 84 | Temp 98.6°F | Ht 62.0 in | Wt 120.0 lb

## 2017-05-25 DIAGNOSIS — N898 Other specified noninflammatory disorders of vagina: Secondary | ICD-10-CM

## 2017-05-25 DIAGNOSIS — R21 Rash and other nonspecific skin eruption: Secondary | ICD-10-CM

## 2017-05-25 DIAGNOSIS — R399 Unspecified symptoms and signs involving the genitourinary system: Secondary | ICD-10-CM

## 2017-05-25 LAB — URINALYSIS
Bilirubin, UA: NEGATIVE
Glucose, UA: NEGATIVE
Ketones, UA: NEGATIVE
Leukocytes, UA: NEGATIVE
Nitrite, UA: NEGATIVE
Protein, UA: NEGATIVE
RBC, UA: NEGATIVE
Specific Gravity, UA: <1.005 — ABNORMAL LOW (ref 1.005–1.030)
Urobilinogen, Ur: 0.2 mg/dL (ref 0.2–1.0)
pH, UA: 6 (ref 5.0–7.5)

## 2017-05-25 LAB — WET PREP FOR TRICH, YEAST, CLUE
Clue Cell Exam: NEGATIVE
Trichomonas Exam: NEGATIVE
Yeast Exam: NEGATIVE

## 2017-05-25 NOTE — Patient Instructions (Addendum)
Your rash looks to be some type of contact dermatitis. Because your asymptomatic, there is no need to apply any creams or treat this. You urinalysis was negative for evidence of infection today. I will call you once your vaginal sample has resulted. If you notice that the rash becomes worse, more extensive, you develop redness, swelling, pus, pain, fevers, chills, please seek immediate medical attention. I will contact you will the results of your labs via MyChart.  If anything is abnormal, I will call you.    Healthy vaginal hygiene practices   -  Avoid sleeper pajamas. Nightgowns allow air to circulate.  Sleep without underpants whenever possible.  -  Wear cotton underpants during the day. Double-rinse underwear after washing to avoid residual irritants. Do not use fabric softeners for underwear and swimsuits.  - Avoid tights, leotards, leggings, "skinny" jeans, and other tight-fitting clothing. Skirts and loose-fitting pants allow air to circulate.  - Avoid pantyliners.  Instead use tampons or cotton pads.  - Daily warm bathing is helpful:     - Soak in clean water (no soap) for 10 to 15 minutes. Adding vinegar or baking soda to the water has not been specifically studied and may not be better than clean water alone.      - Use soap to wash regions other than the genital area just before getting out of the tub. Limit use of any soap on genital areas. Use fragance-free soaps.     - Rinse the genital area well and gently pat dry.  Don't rub.  Hair dryer to assist with drying can be used only if on cool setting.     - Do not use bubble baths or perfumed soaps.  - Do not use any feminine sprays, douches or powders.  These contain chemicals that will irritate the skin.  - If the genital area is tender or swollen, cool compresses may relieve the discomfort. Unscented wet wipes can be used instead of toilet paper for wiping.   - Emollients, such as Vaseline, may help protect skin and can be applied  to the irritated area.  - Always remember to wipe front-to-back after bowel movements. Pat dry after urination.  - Do not sit in wet swimsuits for long periods of time after swimming

## 2017-05-25 NOTE — Progress Notes (Signed)
Subjective: CC:urinary symptoms and rash PCP: Bennie Pierini, FNP Summer Spencer is a 26 y.o. female presenting to clinic today for:  1. Urinary symptoms/ Vaginal discharge Patient reports that about 2 days ago she had onset of pelvic pressure, urinary urgency and a greenish vaginal discharge. She reports of associated vaginal itching. Denies nausea vomiting, fevers, chills. She does note that she is sexually active with one female partner, her husband. Relation is monogamous. She does note a past medical history significant for Trichomonas several years ago.Patient's last menstrual period was 05/13/2017.  2. Rash Patient reports onset of a rash on her right forearm. She notes that she had contact with a yellow spider last week. She does not recall a bite. She denies injury. No itching, pain, swelling, increased warmth, fevers, malaise. She is not using anything on rash.  Allergies  Allergen Reactions  . Septra [Bactrim] Rash  . Sulfamethoxazole Rash   Past Medical History:  Diagnosis Date  . Bronchitis   . Diabetes mellitus without complication (HCC)   . Hypoglycemia    Family History  Problem Relation Age of Onset  . Hypertension Father   . Diabetes Maternal Grandmother   . Liver disease Maternal Grandfather   . Hypertension Paternal Grandmother   . Hypertension Other   . Hyperlipidemia Other   . Anesthesia problems Neg Hx    Social Hx: non smoker.Current medications reviewed.   ROS: Per HPI  Objective: Office vital signs reviewed. BP 109/69   Pulse 84   Temp 98.6 F (37 C) (Oral)   Ht  (1.575 m)   Wt 120 lb (54.4 kg)   LMP 05/13/2017   BMI 21.95 kg/m   Physical Examination:  General: Awake, alert, well nourished, No acute distress GU: external vaginal tissue normal, cervix not well visualized 2/2 copious opaque mucous discharge present. no bleeding, no abdominal/ adnexal masses, no abdominal TTP Extremities: warm, well perfused, No edema,  cyanosis or clubbing; +2 pulses bilaterally Skin: dry; intact; several, small, well-circumscribed, flat, non-blanching lesions in a rectangular grid formation approximately 1 inch by half an inch in size. No increased warmth, nontender, nonfluctuant, no surrounding erythema, no palpable induration.   No results found for this or any previous visit (from the past 24 hour(s)).  Assessment/ Plan: 26 y.o. female   1. Urinary symptom or sign Urinalysis unremarkable. I suspect that her symptoms are stemming from urethral irritation secondary to possible vaginal infection. Return precautions were reviewed. Patient was good understanding. - Urinalysis  2. Vaginal discharge Copious, opaque mucous discharge appreciated on today's exam. Vaginal exam otherwise unremarkable. She does have a history of Trichomonas in the past. Wet prep was sent to evaluate for this. There was no vaginal odor on exam, so that she'll vaginosis less likely. If patient requires oral antibiotic, we will plan to send in Diflucan as well she often has yeast infections with antibiotics. - WET PREP FOR TRICH, YEAST, CLUE  3. Rash It appears that rash is likely a result of some type of contact. It has a very specific, symmetric pattern. She cannot identify anything except for an orange spider last week. She does not recall being bitten. No other concerning or systemic symptoms. It essentially asymptomatic. For this reason, we will watch rash and monitor for resolution. If she develops any symptoms, she will return for reevaluation.   Orders Placed This Encounter  ProcZO:[id number] . Urinalysis   No orders of the defined types were placed in this encounter.  Summer Norlander, DO [facility] [phone]

## 2017-06-14 DIAGNOSIS — Z794 Long term (current) use of insulin: Secondary | ICD-10-CM | POA: Diagnosis not present

## 2017-06-14 DIAGNOSIS — E1065 Type 1 diabetes mellitus with hyperglycemia: Secondary | ICD-10-CM | POA: Diagnosis not present

## 2017-06-14 DIAGNOSIS — Z87891 Personal history of nicotine dependence: Secondary | ICD-10-CM | POA: Diagnosis not present

## 2017-08-10 DIAGNOSIS — E1065 Type 1 diabetes mellitus with hyperglycemia: Secondary | ICD-10-CM | POA: Diagnosis not present

## 2017-08-15 DIAGNOSIS — E1065 Type 1 diabetes mellitus with hyperglycemia: Secondary | ICD-10-CM | POA: Diagnosis not present

## 2017-08-15 DIAGNOSIS — Z833 Family history of diabetes mellitus: Secondary | ICD-10-CM | POA: Diagnosis not present

## 2017-08-15 DIAGNOSIS — Z794 Long term (current) use of insulin: Secondary | ICD-10-CM | POA: Diagnosis not present

## 2017-10-17 DIAGNOSIS — E1065 Type 1 diabetes mellitus with hyperglycemia: Secondary | ICD-10-CM | POA: Diagnosis not present

## 2017-10-20 DIAGNOSIS — E1065 Type 1 diabetes mellitus with hyperglycemia: Secondary | ICD-10-CM | POA: Diagnosis not present

## 2017-10-20 DIAGNOSIS — Z794 Long term (current) use of insulin: Secondary | ICD-10-CM | POA: Diagnosis not present

## 2017-11-16 ENCOUNTER — Encounter: Payer: Self-pay | Admitting: Nurse Practitioner

## 2017-11-17 ENCOUNTER — Other Ambulatory Visit: Payer: Self-pay | Admitting: Nurse Practitioner

## 2017-11-17 MED ORDER — METRONIDAZOLE 500 MG PO TABS: 500.0000 mg | ORAL_TABLET | Freq: Two times a day (BID) | ORAL | 0 refills | Status: DC

## 2017-12-21 ENCOUNTER — Other Ambulatory Visit: Payer: Self-pay | Admitting: Family

## 2017-12-21 ENCOUNTER — Encounter: Payer: Self-pay | Admitting: Nurse Practitioner

## 2017-12-22 ENCOUNTER — Encounter: Payer: Self-pay | Admitting: Nurse Practitioner

## 2017-12-22 ENCOUNTER — Ambulatory Visit (INDEPENDENT_AMBULATORY_CARE_PROVIDER_SITE_OTHER): Payer: BLUE CROSS/BLUE SHIELD | Admitting: Nurse Practitioner

## 2017-12-22 VITALS — BP 111/70 | HR 74 | Temp 97.9°F | Ht 62.0 in | Wt 117.0 lb

## 2017-12-22 DIAGNOSIS — N760 Acute vaginitis: Secondary | ICD-10-CM

## 2017-12-22 DIAGNOSIS — R300 Dysuria: Secondary | ICD-10-CM

## 2017-12-22 DIAGNOSIS — N949 Unspecified condition associated with female genital organs and menstrual cycle: Secondary | ICD-10-CM | POA: Diagnosis not present

## 2017-12-22 DIAGNOSIS — B9689 Other specified bacterial agents as the cause of diseases classified elsewhere: Secondary | ICD-10-CM

## 2017-12-22 LAB — URINALYSIS, COMPLETE
Bilirubin, UA: NEGATIVE
Glucose, UA: NEGATIVE
Ketones, UA: NEGATIVE
Leukocytes, UA: NEGATIVE
Nitrite, UA: NEGATIVE
Protein, UA: NEGATIVE
RBC, UA: NEGATIVE
Specific Gravity, UA: 1.01 (ref 1.005–1.030)
Urobilinogen, Ur: 0.2 mg/dL (ref 0.2–1.0)
pH, UA: 7 (ref 5.0–7.5)

## 2017-12-22 LAB — MICROSCOPIC EXAMINATION
Bacteria, UA: NONE SEEN
RBC, UA: NONE SEEN /hpf (ref 0–2)
Renal Epithel, UA: NONE SEEN /hpf

## 2017-12-22 LAB — WET PREP FOR TRICH, YEAST, CLUE
Clue Cell Exam: NEGATIVE
Trichomonas Exam: NEGATIVE
Yeast Exam: NEGATIVE

## 2017-12-22 MED ORDER — METRONIDAZOLE 500 MG PO TABS: 500.0000 mg | ORAL_TABLET | Freq: Two times a day (BID) | ORAL | 0 refills | Status: DC

## 2017-12-22 NOTE — Progress Notes (Signed)
   Subjective:    Patient ID: Summer Spencer, female    DOB: 08/02/1991, 27 y.o.   MRN: [id number]  HPI  Patient comes in today c/o vaginal discharge that occurs after her period every month. She has had this greenish discharge every month. If she drinks a lot of water it will clear up. She did a self swab today.   Review of Systems  Constitutional: Negative for activity change and appetite change.  HENT: Negative.   Eyes: Negative for pain.  Respiratory: Negative for shortness of breath.   Cardiovascular: Negative for chest pain, palpitations and leg swelling.  Gastrointestinal: Negative for abdominal pain.  Endocrine: Negative for polydipsia.  Genitourinary: Negative.   Skin: Negative for rash.  Neurological: Negative for dizziness, weakness and headaches.  Hematological: Does not bruise/bleed easily.  Psychiatric/Behavioral: Negative.   All other systems reviewed and are negative.      Objective:   Physical Exam  Constitutional: She is oriented to person, place, and time. She appears well-developed and well-nourished. No distress.  Cardiovascular: Normal rate and regular rhythm.  Pulmonary/Chest: Effort normal and breath sounds normal.  Genitourinary:  Genitourinary Comments: Self wet prep.  Neurological: She is alert and oriented to person, place, and time.  Skin: Skin is warm.  Psychiatric: She has a normal mood and affect. Her behavior is normal. Judgment and thought content normal.   BP 111/70   Pulse 74   Temp 97.9 F (36.6 C) (Oral)   Ht 5\' 2"  (1.575 m)   Wt 117 lb (53.1 kg)   BMI 21.40 kg/m   Wet prep- lots of bacteria     Assessment & Plan:   1. Dysuria   2. Vaginal burning   3. Bacterial vaginosis    Meds ordered this encounter  Medications  . metroNIDAZOLE (FLAGYL) 500 MG tablet    Sig: Take 1 tablet (500 mg total) by mouth 2 (two) times daily.    Dispense:  14 tablet    Refill:  0    Order Specific Question:   Supervising Provider    Answer:    Oswaldo DoneVINCENT, CAROL L [[id number]]   No douching No bubble baths RTO prn  Mary-Margaret Daphine DeutscherMartin, FNP

## 2017-12-22 NOTE — Patient Instructions (Signed)

## 2018-01-16 DIAGNOSIS — E1065 Type 1 diabetes mellitus with hyperglycemia: Secondary | ICD-10-CM | POA: Diagnosis not present

## 2018-01-19 DIAGNOSIS — N760 Acute vaginitis: Secondary | ICD-10-CM | POA: Diagnosis not present

## 2018-01-19 DIAGNOSIS — Z113 Encounter for screening for infections with a predominantly sexual mode of transmission: Secondary | ICD-10-CM | POA: Diagnosis not present

## 2018-01-19 DIAGNOSIS — E1065 Type 1 diabetes mellitus with hyperglycemia: Secondary | ICD-10-CM | POA: Diagnosis not present

## 2018-02-08 DIAGNOSIS — F4323 Adjustment disorder with mixed anxiety and depressed mood: Secondary | ICD-10-CM | POA: Diagnosis not present

## 2018-04-20 DIAGNOSIS — Z6820 Body mass index (BMI) 20.0-20.9, adult: Secondary | ICD-10-CM | POA: Diagnosis not present

## 2018-04-20 DIAGNOSIS — Z01419 Encounter for gynecological examination (general) (routine) without abnormal findings: Secondary | ICD-10-CM | POA: Diagnosis not present

## 2018-04-24 DIAGNOSIS — E1065 Type 1 diabetes mellitus with hyperglycemia: Secondary | ICD-10-CM | POA: Diagnosis not present

## 2018-04-26 DIAGNOSIS — E1065 Type 1 diabetes mellitus with hyperglycemia: Secondary | ICD-10-CM | POA: Diagnosis not present

## 2018-04-26 DIAGNOSIS — Z87891 Personal history of nicotine dependence: Secondary | ICD-10-CM | POA: Diagnosis not present

## 2018-09-01 DIAGNOSIS — E1065 Type 1 diabetes mellitus with hyperglycemia: Secondary | ICD-10-CM | POA: Diagnosis not present

## 2018-09-07 DIAGNOSIS — E1065 Type 1 diabetes mellitus with hyperglycemia: Secondary | ICD-10-CM | POA: Diagnosis not present

## 2018-09-25 DIAGNOSIS — B360 Pityriasis versicolor: Secondary | ICD-10-CM | POA: Diagnosis not present

## 2018-09-26 ENCOUNTER — Encounter: Payer: Self-pay | Admitting: Nurse Practitioner

## 2018-09-26 ENCOUNTER — Ambulatory Visit (INDEPENDENT_AMBULATORY_CARE_PROVIDER_SITE_OTHER): Payer: BLUE CROSS/BLUE SHIELD | Admitting: Nurse Practitioner

## 2018-09-26 VITALS — BP 116/70 | HR 85 | Temp 98.2°F | Ht 62.0 in | Wt 120.0 lb

## 2018-09-26 DIAGNOSIS — R05 Cough: Secondary | ICD-10-CM | POA: Diagnosis not present

## 2018-09-26 DIAGNOSIS — R059 Cough, unspecified: Secondary | ICD-10-CM

## 2018-09-26 MED ORDER — PREDNISONE 20 MG PO TABS: ORAL_TABLET | ORAL | 0 refills | Status: DC

## 2018-09-26 NOTE — Progress Notes (Signed)
   Subjective:    Patient ID: Summer Spencer, female    DOB: 12/12/1990, 28 y.o.   MRN: [id number]   Chief Complaint: Cough (2 weeks)   HPI Patient comes in today c/o cough for the last 2 weeks. Started out as congestion.   Review of Systems  Constitutional: Negative for chills and fever.  HENT: Positive for congestion and rhinorrhea. Negative for sore throat and trouble swallowing.   Respiratory: Positive for cough. Negative for shortness of breath.   Neurological: Positive for headaches.  All other systems reviewed and are negative.      Objective:   Physical Exam Vitals signs and nursing note reviewed.  Constitutional:      General: She is not in acute distress.    Appearance: She is normal weight.  HENT:     Right Ear: Tympanic membrane, ear canal and external ear normal.     Left Ear: Tympanic membrane, ear canal and external ear normal.     Nose: Nose normal.     Mouth/Throat:     Mouth: Mucous membranes are moist.  Eyes:     Extraocular Movements: Extraocular movements intact.     Pupils: Pupils are equal, round, and reactive to light.  Neck:     Musculoskeletal: Normal range of motion. No neck rigidity.  Cardiovascular:     Rate and Rhythm: Normal rate and regular rhythm.     Heart sounds: Normal heart sounds.  Pulmonary:     Effort: Pulmonary effort is normal.     Breath sounds: Normal breath sounds.     Comments: Deep dry cough  Skin:    General: Skin is warm and dry.  Neurological:     General: No focal deficit present.     Mental Status: She is alert.  Psychiatric:        Mood and Affect: Mood normal.        Behavior: Behavior normal.    BP 116/70   Pulse 85   Temp 98.2 F (36.8 C) (Oral)   Ht 5\' 2"  (1.575 m)   Wt 120 lb (54.4 kg)   BMI 21.95 kg/m         Assessment & Plan:  Summer Spencer in today with chief complaint of Cough (2 weeks)   1. Cough Force fluids Increase tresiba to 25 u while on prednisone and make sure do  sliding scale 3x a day with meals Force fluids  continue delsym OTC - predniSONE (DELTASONE) 20 MG tablet; 2 po at sametime daily for 5 days  Dispense: 10 tablet; Refill: 0  Mary-Margaret Daphine DeutscherMartin, FNP

## 2018-09-26 NOTE — Patient Instructions (Signed)

## 2018-09-29 ENCOUNTER — Telehealth: Payer: Self-pay | Admitting: Nurse Practitioner

## 2018-09-29 ENCOUNTER — Telehealth: Payer: Self-pay

## 2018-09-29 ENCOUNTER — Other Ambulatory Visit: Payer: Self-pay | Admitting: Family

## 2018-09-29 MED ORDER — OSELTAMIVIR PHOSPHATE 75 MG PO CAPS: 75.0000 mg | ORAL_CAPSULE | Freq: Two times a day (BID) | ORAL | 0 refills | Status: DC

## 2018-09-29 NOTE — Telephone Encounter (Signed)
Patient recently saw [facility]. Flu test was negative and was given prednisone. Emailed yesterday stating that symptoms had worsened. Her son was diagnosed with the flu today. She is still sick. Should she be treated for flu? Please review and advise

## 2018-09-29 NOTE — Telephone Encounter (Signed)
Pt aware.

## 2018-09-29 NOTE — Telephone Encounter (Signed)
Tamiflu Prescription sent to pharmacy   

## 2018-09-29 NOTE — Telephone Encounter (Signed)
According to [facility] note symptoms had been on going for almost two weeks. It is possible she could get the flu from her son, but she is out of the window for Tamiflu. Rest, force fluids, tylenol or motrin prn for fever and pain. If her symptoms continue to worsen she will need to be seen to rule out pneumonia.

## 2018-10-01 DIAGNOSIS — J111 Influenza due to unidentified influenza virus with other respiratory manifestations: Secondary | ICD-10-CM | POA: Diagnosis not present

## 2018-10-01 DIAGNOSIS — R509 Fever, unspecified: Secondary | ICD-10-CM | POA: Diagnosis not present

## 2018-10-01 DIAGNOSIS — R112 Nausea with vomiting, unspecified: Secondary | ICD-10-CM | POA: Diagnosis not present

## 2018-10-01 DIAGNOSIS — Z3202 Encounter for pregnancy test, result negative: Secondary | ICD-10-CM | POA: Diagnosis not present

## 2018-10-01 DIAGNOSIS — R05 Cough: Secondary | ICD-10-CM | POA: Diagnosis not present

## 2018-10-12 ENCOUNTER — Ambulatory Visit (INDEPENDENT_AMBULATORY_CARE_PROVIDER_SITE_OTHER): Payer: BLUE CROSS/BLUE SHIELD | Admitting: Nurse Practitioner

## 2018-10-12 ENCOUNTER — Encounter: Payer: Self-pay | Admitting: Nurse Practitioner

## 2018-10-12 VITALS — BP 125/76 | HR 103 | Temp 98.5°F | Ht 62.0 in | Wt 120.0 lb

## 2018-10-12 DIAGNOSIS — L309 Dermatitis, unspecified: Secondary | ICD-10-CM

## 2018-10-12 DIAGNOSIS — R21 Rash and other nonspecific skin eruption: Secondary | ICD-10-CM

## 2018-10-12 MED ORDER — CLOTRIMAZOLE-BETAMETHASONE 1-0.05 % EX CREA: 1.0000 "application " | TOPICAL_CREAM | Freq: Two times a day (BID) | CUTANEOUS | 0 refills | Status: DC

## 2018-10-12 NOTE — Patient Instructions (Signed)
Rosacea  Rosacea is a long-term (chronic) condition that affects the skin of the face, including the cheeks, nose, forehead, and chin. This condition can also affect the eyes. Rosacea causes blood vessels near the surface of the skin to get bigger (be enlarged), and that makes the skin red.  What are the causes?  The cause of this condition is not known. Certain things can make rosacea worse, including:  · Hot baths.  · Exercise.  · Sunlight.  · Very hot or cold temperatures.  · Hot or spicy foods and drinks.  · Drinking alcohol.  · Stress.  · Taking blood pressure medicine.  · Long-term use of topical steroids on the face.  What increases the risk?  You are more likely to get this condition if you:  · Are older than 30 years of age.  · Are a woman.  · Have light-colored skin (light complexion).  · Have a family history of the condition.  What are the signs or symptoms?    · Redness of the face.  · Red bumps or pimples on the face.  · A red, enlarged nose.  · Blushing easily.  · Red lines on the skin.  · Irritated, burning, or itchy feeling in the eyes.  · Swollen eyelids.  · Drainage from the eyes.  · Feeling like there is something in your eye.  How is this treated?  There is no cure for this condition, but treatment can help to control your symptoms. Your doctor may suggest that you see a skin specialist (dermatologist). Treatment may include:  · Medicines that are put on the skin or taken by mouth (orally).  · Laser treatment to improve how the skin looks.  · Surgery. This is rare.  Your doctor will also suggest the best way to take care of your skin. Even after your skin gets better, you will likely need to continue treatment to keep your rosacea from coming back.  Follow these instructions at home:  Skin care  Take care of your skin as told by your doctor. Your doctor may tell you to do these things:  · Wash your skin gently two or more times each day.  · Use mild soap.  · Use a sunscreen or sunblock with SPF  30 or greater.  · Use gentle cosmetics that are meant for sensitive skin.  · Shave with an electric shaver instead of a blade.  Lifestyle  · Try to keep track of what foods make this condition worse. Avoid those foods. These may include:  ? Spicy foods.  ? Seafood.  ? Cheese.  ? Hot liquids.  ? Nuts.  ? Chocolate.  ? Iodized salt.  · Do not drink alcohol.  · Avoid very cold or hot temperatures.  · Try to reduce your stress. If you need help to do this, talk with your doctor.  · When you exercise, do these things to stay cool:  ? Limit sun exposure to your face.  ? Use a fan.  ? Exercise for a shorter time, and exercise more often.  General instructions  · Take and apply over-the-counter and prescription medicines only as told by your doctor.  · If you were prescribed an antibiotic medicine, apply it or take it as told by your doctor. Do not stop using the antibiotic even if your condition improves.  · If your eyelids are affected, hold warm compresses on them. Do this as told by your doctor.  · Keep all   follow-up visits as told by your doctor. This is important.  Contact a doctor if:  · Your symptoms get worse.  · Your symptoms do not improve after 2 months of treatment.  · You have new symptoms.  · You have any changes in how you see (vision) or you have problems with your eyes, such as redness or itching.  · You feel very sad (depressed).  · You do not want to eat as much as normal (lose your appetite).  · You have trouble focusing your mind (concentrating).  Summary  · Rosacea is a long-term condition that affects the skin of the face, including the cheeks, nose, forehead, and chin.  · Take care of your skin as told by your doctor.  · Take and apply medicines only as told by your doctor.  · Contact a doctor if your symptoms get worse or if you have problems with your eyes.  This information is not intended to replace advice given to you by your health care provider. Make sure you discuss any questions you have  with your health care provider.  Document Released: 11/08/2011 Document Revised: 01/18/2018 Document Reviewed: 01/18/2018  Elsevier Interactive Patient Education © 2019 [employer]

## 2018-10-12 NOTE — Progress Notes (Signed)
   Subjective:    Patient ID: Summer Spencer General, female    DOB: 12/01/1990, 28 y.o.   MRN: [id number]   Chief Complaint: Rash on face (At night time) and Rash on left hand (Itches  Started Saturday)   HPI Patient comesin today c/o rash on face. She was using lipikar on face. She stopped using it and then her face broke out. She has not gone back to using it. She also has a rash on the dorsal surface of her left hand that is raised and itchy. She has been using cerave on it OTC and that helsp it.   Review of Systems  Constitutional: Negative.   HENT: Negative.   Respiratory: Negative.   Cardiovascular: Negative.   Musculoskeletal: Negative.   Skin: Positive for rash (face and dorsal surface of left hand).  Neurological: Negative.   Psychiatric/Behavioral: Negative.   All other systems reviewed and are negative.      Objective:   Physical Exam Vitals signs and nursing note reviewed.  Constitutional:      General: She is not in acute distress.    Appearance: She is normal weight.  Cardiovascular:     Rate and Rhythm: Normal rate and regular rhythm.     Heart sounds: Normal heart sounds.  Pulmonary:     Breath sounds: Normal breath sounds.  Skin:    General: Skin is warm.     Findings: Rash present.     Comments: Erythematous macular rash on bil cheeks Erythematous maculo-apular patch on dorsal surface of left hand at 3rd and 4th metacarpal head.  Neurological:     General: No focal deficit present.     Mental Status: She is alert and oriented to person, place, and time.  Psychiatric:        Mood and Affect: Mood normal.        Behavior: Behavior normal.    BP 125/76   Pulse (!) 103   Temp 98.5 F (36.9 C) (Oral)   Ht 5\' 2"  (1.575 m)   Wt 120 lb (54.4 kg)   BMI 21.95 kg/m       Assessment & Plan:  Summer Spencer in today with chief complaint of Rash on face (At night time) and Rash on left hand (Itches  Started Saturday)   1. Hand dermatitis Wear gloves  when watching dishes Avoid harsh soaps Avoid scratching Meds ordered this encounter  Medications  . clotrimazole-betamethasone (LOTRISONE) cream    Sig: Apply 1 application topically 2 (two) times daily.    Dispense:  30 g    Refill:  0    Order Specific Question:   Supervising Provider    Answer:   Arville Care A [[id number]]     2. Facial rash cetaphil OTC Avoid makeups No harsh soaps If cetaphil does not help - may be rosacea and we will try metro gel RTO if not improving  Summer Daphine Deutscher, FNP

## 2019-01-08 DIAGNOSIS — E1065 Type 1 diabetes mellitus with hyperglycemia: Secondary | ICD-10-CM | POA: Diagnosis not present

## 2019-01-09 DIAGNOSIS — E1065 Type 1 diabetes mellitus with hyperglycemia: Secondary | ICD-10-CM | POA: Diagnosis not present

## 2019-01-29 ENCOUNTER — Other Ambulatory Visit: Payer: Self-pay

## 2019-01-29 ENCOUNTER — Ambulatory Visit (INDEPENDENT_AMBULATORY_CARE_PROVIDER_SITE_OTHER): Payer: BLUE CROSS/BLUE SHIELD | Admitting: *Deleted

## 2019-01-29 DIAGNOSIS — Z23 Encounter for immunization: Secondary | ICD-10-CM

## 2019-01-29 NOTE — Progress Notes (Signed)
Pt hit in nose with shovel Requesting Td vaccine Vaccine given Pt tolerated well

## 2019-04-09 DIAGNOSIS — E1065 Type 1 diabetes mellitus with hyperglycemia: Secondary | ICD-10-CM | POA: Diagnosis not present

## 2019-04-12 DIAGNOSIS — E1065 Type 1 diabetes mellitus with hyperglycemia: Secondary | ICD-10-CM | POA: Diagnosis not present

## 2019-04-12 DIAGNOSIS — Z87891 Personal history of nicotine dependence: Secondary | ICD-10-CM | POA: Diagnosis not present

## 2019-04-12 DIAGNOSIS — Z794 Long term (current) use of insulin: Secondary | ICD-10-CM | POA: Diagnosis not present

## 2019-04-17 ENCOUNTER — Ambulatory Visit (INDEPENDENT_AMBULATORY_CARE_PROVIDER_SITE_OTHER): Payer: BLUE CROSS/BLUE SHIELD | Admitting: Nurse Practitioner

## 2019-04-17 ENCOUNTER — Encounter: Payer: Self-pay | Admitting: Nurse Practitioner

## 2019-04-17 DIAGNOSIS — B373 Candidiasis of vulva and vagina: Secondary | ICD-10-CM

## 2019-04-17 DIAGNOSIS — B3731 Acute candidiasis of vulva and vagina: Secondary | ICD-10-CM

## 2019-04-17 DIAGNOSIS — B370 Candidal stomatitis: Secondary | ICD-10-CM

## 2019-04-17 MED ORDER — FLUCONAZOLE 150 MG PO TABS
150.0000 mg | ORAL_TABLET | Freq: Once | ORAL | 0 refills | Status: AC
Start: 2019-04-17 — End: 2019-04-17

## 2019-04-17 MED ORDER — NYSTATIN 100000 UNIT/ML MT SUSP: 5.0000 mL | Freq: Four times a day (QID) | OROMUCOSAL | 0 refills | Status: DC

## 2019-04-17 NOTE — Progress Notes (Signed)
   Virtual Visit via telephone Note Due to COVID-19 pandemic this visit was conducted virtually. This visit type was conducted due to national recommendations for restrictions regarding the COVID-19 Pandemic (e.g. social distancing, sheltering in place) in an effort to limit this patient's exposure and mitigate transmission in our community. All issues noted in this document were discussed and addressed.  A physical exam was not performed with this format.  I connected with Summer Spencer on 04/17/19 at 1:40 by telephone and verified that I am speaking with the correct person using two identifiers. Summer Spencer is currently located at home and her kids is currently with her during visit. The provider, Mary-Margaret Hassell Done, FNP is located in their office at time of visit.  I discussed the limitations, risks, security and privacy concerns of performing an evaluation and management service by telephone and the availability of in person appointments. I also discussed with the patient that there may be a patient responsible charge related to this service. The patient expressed understanding and agreed to proceed.   History and Present Illness:  Patient calls in c/o tongue feeling raw with white patches. She also has vaginal discharge with itching   Review of Systems  Constitutional: Negative.   Respiratory: Negative.   Cardiovascular: Negative.   Genitourinary:       Vaginal discharge and itching  All other systems reviewed and are negative.    Observations/Objective: Alert and oriented- answers all questions appropriately No distress  Assessment and Plan: Summer Spencer in today with chief complaint of Vaginitis   1. Vaginal candidiasis No douching No bubble baths - fluconazole (DIFLUCAN) 150 MG tablet; Take 1 tablet (150 mg total) by mouth once for 1 dose.  Dispense: 1 tablet; Refill: 0  2. Thrush - nystatin (MYCOSTATIN) 100000 UNIT/ML suspension; Take 5 mLs (500,000 Units  total) by mouth 4 (four) times daily.  Dispense: 60 mL; Refill: 0   Follow Up Instructions: prn    I discussed the assessment and treatment plan with the patient. The patient was provided an opportunity to ask questions and all were answered. The patient agreed with the plan and demonstrated an understanding of the instructions.   The patient was advised to call back or seek an in-person evaluation if the symptoms worsen or if the condition fails to improve as anticipated.  The above assessment and management plan was discussed with the patient. The patient verbalized understanding of and has agreed to the management plan. Patient is aware to call the clinic if symptoms persist or worsen. Patient is aware when to return to the clinic for a follow-up visit. Patient educated on when it is appropriate to go to the emergency department.   Time call ended:  11:50 I provided 10 minutes of non-face-to-face time during this encounter.    Mary-Margaret Hassell Done, FNP

## 2019-06-05 DIAGNOSIS — F4320 Adjustment disorder, unspecified: Secondary | ICD-10-CM | POA: Diagnosis not present

## 2019-06-21 DIAGNOSIS — F4322 Adjustment disorder with anxiety: Secondary | ICD-10-CM | POA: Diagnosis not present

## 2019-07-12 DIAGNOSIS — E1065 Type 1 diabetes mellitus with hyperglycemia: Secondary | ICD-10-CM | POA: Diagnosis not present

## 2019-07-12 DIAGNOSIS — F4322 Adjustment disorder with anxiety: Secondary | ICD-10-CM | POA: Diagnosis not present

## 2019-07-16 DIAGNOSIS — E1065 Type 1 diabetes mellitus with hyperglycemia: Secondary | ICD-10-CM | POA: Diagnosis not present

## 2019-09-13 DIAGNOSIS — Z01419 Encounter for gynecological examination (general) (routine) without abnormal findings: Secondary | ICD-10-CM | POA: Diagnosis not present

## 2019-09-13 DIAGNOSIS — N760 Acute vaginitis: Secondary | ICD-10-CM | POA: Diagnosis not present

## 2019-09-13 DIAGNOSIS — Z6821 Body mass index (BMI) 21.0-21.9, adult: Secondary | ICD-10-CM | POA: Diagnosis not present

## 2019-10-19 DIAGNOSIS — E1065 Type 1 diabetes mellitus with hyperglycemia: Secondary | ICD-10-CM | POA: Diagnosis not present

## 2019-10-19 LAB — HEMOGLOBIN A1C: Hemoglobin A1C: 6.4

## 2019-10-25 DIAGNOSIS — Z79899 Other long term (current) drug therapy: Secondary | ICD-10-CM | POA: Diagnosis not present

## 2019-10-25 DIAGNOSIS — E1065 Type 1 diabetes mellitus with hyperglycemia: Secondary | ICD-10-CM | POA: Diagnosis not present

## 2019-10-25 DIAGNOSIS — Z794 Long term (current) use of insulin: Secondary | ICD-10-CM | POA: Diagnosis not present

## 2019-12-25 DIAGNOSIS — H5210 Myopia, unspecified eye: Secondary | ICD-10-CM | POA: Diagnosis not present

## 2020-01-01 ENCOUNTER — Encounter: Payer: Self-pay | Admitting: *Deleted

## 2020-01-21 DIAGNOSIS — E1065 Type 1 diabetes mellitus with hyperglycemia: Secondary | ICD-10-CM | POA: Diagnosis not present

## 2020-01-23 DIAGNOSIS — E1065 Type 1 diabetes mellitus with hyperglycemia: Secondary | ICD-10-CM | POA: Diagnosis not present

## 2020-01-23 DIAGNOSIS — Z794 Long term (current) use of insulin: Secondary | ICD-10-CM | POA: Diagnosis not present

## 2020-02-27 DIAGNOSIS — B373 Candidiasis of vulva and vagina: Secondary | ICD-10-CM | POA: Diagnosis not present

## 2020-02-27 DIAGNOSIS — N760 Acute vaginitis: Secondary | ICD-10-CM | POA: Diagnosis not present

## 2020-02-27 DIAGNOSIS — N761 Subacute and chronic vaginitis: Secondary | ICD-10-CM | POA: Diagnosis not present

## 2020-04-21 DIAGNOSIS — E1065 Type 1 diabetes mellitus with hyperglycemia: Secondary | ICD-10-CM | POA: Diagnosis not present

## 2020-04-28 DIAGNOSIS — E109 Type 1 diabetes mellitus without complications: Secondary | ICD-10-CM | POA: Diagnosis not present

## 2020-04-28 DIAGNOSIS — Z87891 Personal history of nicotine dependence: Secondary | ICD-10-CM | POA: Diagnosis not present

## 2020-08-03 ENCOUNTER — Other Ambulatory Visit: Payer: Self-pay | Admitting: Nurse Practitioner

## 2020-08-03 DIAGNOSIS — U071 COVID-19: Secondary | ICD-10-CM | POA: Diagnosis not present

## 2020-08-04 DIAGNOSIS — U071 COVID-19: Secondary | ICD-10-CM | POA: Diagnosis not present

## 2020-08-04 DIAGNOSIS — J029 Acute pharyngitis, unspecified: Secondary | ICD-10-CM | POA: Diagnosis not present

## 2020-08-04 DIAGNOSIS — R519 Headache, unspecified: Secondary | ICD-10-CM | POA: Diagnosis not present

## 2020-08-04 DIAGNOSIS — J0100 Acute maxillary sinusitis, unspecified: Secondary | ICD-10-CM | POA: Diagnosis not present

## 2020-08-05 DIAGNOSIS — U071 COVID-19: Secondary | ICD-10-CM | POA: Diagnosis not present

## 2020-08-05 DIAGNOSIS — Z23 Encounter for immunization: Secondary | ICD-10-CM | POA: Diagnosis not present

## 2020-08-21 DIAGNOSIS — E1065 Type 1 diabetes mellitus with hyperglycemia: Secondary | ICD-10-CM | POA: Diagnosis not present

## 2020-08-25 DIAGNOSIS — E1065 Type 1 diabetes mellitus with hyperglycemia: Secondary | ICD-10-CM | POA: Diagnosis not present

## 2020-08-25 DIAGNOSIS — Z794 Long term (current) use of insulin: Secondary | ICD-10-CM | POA: Diagnosis not present

## 2020-09-23 DIAGNOSIS — Z01419 Encounter for gynecological examination (general) (routine) without abnormal findings: Secondary | ICD-10-CM | POA: Diagnosis not present

## 2020-09-23 DIAGNOSIS — Z6823 Body mass index (BMI) 23.0-23.9, adult: Secondary | ICD-10-CM | POA: Diagnosis not present

## 2020-10-01 ENCOUNTER — Other Ambulatory Visit: Payer: BC Managed Care – PPO

## 2020-10-01 DIAGNOSIS — Z20822 Contact with and (suspected) exposure to covid-19: Secondary | ICD-10-CM | POA: Diagnosis not present

## 2020-10-02 LAB — SARS-COV-2, NAA 2 DAY TAT

## 2020-10-02 LAB — NOVEL CORONAVIRUS, NAA: SARS-CoV-2, NAA: DETECTED — AB

## 2020-12-15 DIAGNOSIS — E1065 Type 1 diabetes mellitus with hyperglycemia: Secondary | ICD-10-CM | POA: Diagnosis not present

## 2020-12-16 DIAGNOSIS — Z978 Presence of other specified devices: Secondary | ICD-10-CM | POA: Insufficient documentation

## 2020-12-17 DIAGNOSIS — Z87891 Personal history of nicotine dependence: Secondary | ICD-10-CM | POA: Diagnosis not present

## 2020-12-17 DIAGNOSIS — E1065 Type 1 diabetes mellitus with hyperglycemia: Secondary | ICD-10-CM | POA: Diagnosis not present

## 2020-12-23 DIAGNOSIS — N760 Acute vaginitis: Secondary | ICD-10-CM | POA: Diagnosis not present

## 2020-12-23 DIAGNOSIS — N926 Irregular menstruation, unspecified: Secondary | ICD-10-CM | POA: Diagnosis not present

## 2021-02-23 DIAGNOSIS — B360 Pityriasis versicolor: Secondary | ICD-10-CM | POA: Diagnosis not present

## 2021-02-23 DIAGNOSIS — L308 Other specified dermatitis: Secondary | ICD-10-CM | POA: Diagnosis not present

## 2021-03-13 DIAGNOSIS — E1065 Type 1 diabetes mellitus with hyperglycemia: Secondary | ICD-10-CM | POA: Diagnosis not present

## 2021-03-17 DIAGNOSIS — Z87891 Personal history of nicotine dependence: Secondary | ICD-10-CM | POA: Diagnosis not present

## 2021-03-17 DIAGNOSIS — Z978 Presence of other specified devices: Secondary | ICD-10-CM | POA: Diagnosis not present

## 2021-03-17 DIAGNOSIS — E1065 Type 1 diabetes mellitus with hyperglycemia: Secondary | ICD-10-CM | POA: Diagnosis not present

## 2021-03-17 DIAGNOSIS — E10649 Type 1 diabetes mellitus with hypoglycemia without coma: Secondary | ICD-10-CM | POA: Diagnosis not present

## 2021-06-12 DIAGNOSIS — E1065 Type 1 diabetes mellitus with hyperglycemia: Secondary | ICD-10-CM | POA: Diagnosis not present

## 2021-06-16 DIAGNOSIS — R7989 Other specified abnormal findings of blood chemistry: Secondary | ICD-10-CM | POA: Diagnosis not present

## 2021-06-16 DIAGNOSIS — Z978 Presence of other specified devices: Secondary | ICD-10-CM | POA: Diagnosis not present

## 2021-06-16 DIAGNOSIS — E1065 Type 1 diabetes mellitus with hyperglycemia: Secondary | ICD-10-CM | POA: Diagnosis not present

## 2021-06-16 DIAGNOSIS — E10649 Type 1 diabetes mellitus with hypoglycemia without coma: Secondary | ICD-10-CM | POA: Diagnosis not present

## 2021-06-30 DIAGNOSIS — R7989 Other specified abnormal findings of blood chemistry: Secondary | ICD-10-CM | POA: Insufficient documentation

## 2021-09-10 DIAGNOSIS — E1065 Type 1 diabetes mellitus with hyperglycemia: Secondary | ICD-10-CM | POA: Diagnosis not present

## 2021-09-15 DIAGNOSIS — E10649 Type 1 diabetes mellitus with hypoglycemia without coma: Secondary | ICD-10-CM | POA: Diagnosis not present

## 2021-09-15 DIAGNOSIS — Z978 Presence of other specified devices: Secondary | ICD-10-CM | POA: Diagnosis not present

## 2021-09-15 DIAGNOSIS — E1065 Type 1 diabetes mellitus with hyperglycemia: Secondary | ICD-10-CM | POA: Diagnosis not present

## 2021-11-21 DIAGNOSIS — T7840XA Allergy, unspecified, initial encounter: Secondary | ICD-10-CM | POA: Diagnosis not present

## 2021-11-25 DIAGNOSIS — Z113 Encounter for screening for infections with a predominantly sexual mode of transmission: Secondary | ICD-10-CM | POA: Diagnosis not present

## 2021-11-25 DIAGNOSIS — N760 Acute vaginitis: Secondary | ICD-10-CM | POA: Diagnosis not present

## 2021-11-27 ENCOUNTER — Ambulatory Visit (INDEPENDENT_AMBULATORY_CARE_PROVIDER_SITE_OTHER): Payer: BC Managed Care – PPO | Admitting: Nurse Practitioner

## 2021-11-27 ENCOUNTER — Encounter: Payer: Self-pay | Admitting: Nurse Practitioner

## 2021-11-27 VITALS — BP 107/73 | HR 71 | Temp 98.7°F | Resp 20 | Ht 62.0 in | Wt 124.0 lb

## 2021-11-27 DIAGNOSIS — B3731 Acute candidiasis of vulva and vagina: Secondary | ICD-10-CM | POA: Diagnosis not present

## 2021-11-27 MED ORDER — FLUCONAZOLE 150 MG PO TABS: ORAL_TABLET | ORAL | 0 refills | Status: DC

## 2021-11-27 NOTE — Progress Notes (Signed)
? ?  Subjective:  ? ? Patient ID: Summer Spencer, female    DOB: September 16, 1990, 31 y.o.   MRN: [id number] ? ? ?Chief Complaint: Vaginal irritation (Called gynecologist 2 weeks ago and they treated over the phone for BV. Since then has broke out in rash and still having vaginal irritation. Wants an exam/) ? ? ?HPI ?Patient states she had telephone visit with GYN2 weeks ago and was treated for BV with flagyl. She is till having symptoms and wants to be checked.she sill has itchy discharge but no longer has odor. ? ? ? ?Review of Systems  ?Constitutional:  Negative for diaphoresis.  ?Eyes:  Negative for pain.  ?Respiratory:  Negative for shortness of breath.   ?Cardiovascular:  Negative for chest pain, palpitations and leg swelling.  ?Gastrointestinal:  Negative for abdominal pain.  ?Endocrine: Negative for polydipsia.  ?Skin:  Negative for rash.  ?Neurological:  Negative for dizziness, weakness and headaches.  ?Hematological:  Does not bruise/bleed easily.  ?All other systems reviewed and are negative. ? ?   ?Objective:  ? Physical Exam ?Vitals and nursing note reviewed.  ?Constitutional:   ?   Appearance: Normal appearance.  ?Cardiovascular:  ?   Rate and Rhythm: Normal rate and regular rhythm.  ?   Heart sounds: Normal heart sounds.  ?Pulmonary:  ?   Effort: Pulmonary effort is normal.  ?   Breath sounds: Normal breath sounds.  ?Genitourinary: ?   General: Normal vulva.  ?   Vagina: Vaginal discharge (thick white cheesy discharge) present.  ?Skin: ?   General: Skin is warm and dry.  ?Neurological:  ?   General: No focal deficit present.  ?   Mental Status: She is alert and oriented to person, place, and time.  ?Psychiatric:     ?   Mood and Affect: Mood normal.     ?   Behavior: Behavior normal.  ? ? ?BP 107/73   Pulse 71   Temp 98.7 ?F (37.1 ?C) (Temporal)   Resp 20   Ht 5\' 2"  (1.575 m)   Wt 124 lb (56.2 kg)   SpO2 100%   BMI 22.68 kg/m?  ? ? ? ?   ?Assessment & Plan:  ? ? Lundahl in today with chief  complaint of Vaginal irritation (Called gynecologist 2 weeks ago and they treated over the phone for BV. Since then has broke out in rash and still having vaginal irritation. Wants an exam/) ? ? ?1. Vaginal candidiasis ?No bubble baths ?Avoid intercourse for 3 days ?- fluconazole (DIFLUCAN) 150 MG tablet; 1 po q week x 4 weeks  Dispense: 4 tablet; Refill: 0 ? ? ? ?The above assessment and management plan was discussed with the patient. The patient verbalized understanding of and has agreed to the management plan. Patient is aware to call the clinic if symptoms persist or worsen. Patient is aware when to return to the clinic for a follow-up visit. Patient educated on when it is appropriate to go to the emergency department.  ? ?Mary-Margaret Currie Paris, FNP ? ? ?

## 2021-11-27 NOTE — Patient Instructions (Signed)
Vaginal Yeast Infection, Adult °Vaginal yeast infection is a condition that causes vaginal discharge as well as soreness, swelling, and redness (inflammation) of the vagina. This is a common condition. Some women get this infection frequently. °What are the causes? °This condition is caused by a change in the normal balance of the yeast (Candida) and normal bacteria that live in the vagina. This change causes an overgrowth of yeast, which causes the inflammation. °What increases the risk? °The condition is more likely to develop in women who: °Take antibiotic medicines. °Have diabetes. °Take birth control pills. °Are pregnant. °Douche often. °Have a weak body defense system (immune system). °Have been taking steroid medicines for a long time. °Frequently wear tight clothing. °What are the signs or symptoms? °Symptoms of this condition include: °White, thick, creamy vaginal discharge. °Swelling, itching, redness, and irritation of the vagina. The lips of the vagina (labia) may be affected as well. °Pain or a burning feeling while urinating. °Pain during sex. °How is this diagnosed? °This condition is diagnosed based on: °Your medical history. °A physical exam. °A pelvic exam. Your health care provider will examine a sample of your vaginal discharge under a microscope. Your health care provider may send this sample for testing to confirm the diagnosis. °How is this treated? °This condition is treated with medicine. Medicines may be over-the-counter or prescription. You may be told to use one or more of the following: °Medicine that is taken by mouth (orally). °Medicine that is applied as a cream (topically). °Medicine that is inserted directly into the vagina (suppository). °Follow these instructions at home: °Take or apply over-the-counter and prescription medicines only as told by your health care provider. °Do not use tampons until your health care provider approves. °Do not have sex until your infection has  cleared. Sex can prolong or worsen your symptoms of infection. Ask your health care provider when it is safe to resume sexual activity. °Keep all follow-up visits. This is important. °How is this prevented? ° °Do not wear tight clothes, such as pantyhose or tight pants. °Wear breathable cotton underwear. °Do not use douches, perfumed soap, creams, or powders. °Wipe from front to back after using the toilet. °If you have diabetes, keep your blood sugar levels under control. °Ask your health care provider for other ways to prevent yeast infections. °Contact a health care provider if: °You have a fever. °Your symptoms go away and then return. °Your symptoms do not get better with treatment. °Your symptoms get worse. °You have new symptoms. °You develop blisters in or around your vagina. °You have blood coming from your vagina and it is not your menstrual period. °You develop pain in your abdomen. °Summary °Vaginal yeast infection is a condition that causes discharge as well as soreness, swelling, and redness (inflammation) of the vagina. °This condition is treated with medicine. Medicines may be over-the-counter or prescription. °Take or apply over-the-counter and prescription medicines only as told by your health care provider. °Do not douche. Resume sexual activity or use of tampons as instructed by your health care provider. °Contact a health care provider if your symptoms do not get better with treatment or your symptoms go away and then return. °This information is not intended to replace advice given to you by your health care provider. Make sure you discuss any questions you have with your health care provider. °Document Revised: 11/03/2020 Document Reviewed: 11/03/2020 °Elsevier Patient Education © 2022 [employer] ° °

## 2021-12-02 DIAGNOSIS — L811 Chloasma: Secondary | ICD-10-CM | POA: Diagnosis not present

## 2021-12-02 DIAGNOSIS — T887XXA Unspecified adverse effect of drug or medicament, initial encounter: Secondary | ICD-10-CM | POA: Diagnosis not present

## 2021-12-02 DIAGNOSIS — L503 Dermatographic urticaria: Secondary | ICD-10-CM | POA: Diagnosis not present

## 2021-12-03 MED ORDER — FLUCONAZOLE 100 MG PO TABS: 100.0000 mg | ORAL_TABLET | Freq: Every day | ORAL | 0 refills | Status: DC

## 2021-12-22 ENCOUNTER — Ambulatory Visit (INDEPENDENT_AMBULATORY_CARE_PROVIDER_SITE_OTHER): Payer: BC Managed Care – PPO | Admitting: Family Medicine

## 2021-12-22 ENCOUNTER — Encounter: Payer: Self-pay | Admitting: Family Medicine

## 2021-12-22 VITALS — BP 114/77 | HR 82 | Temp 98.3°F | Ht 62.0 in | Wt 124.0 lb

## 2021-12-22 DIAGNOSIS — J069 Acute upper respiratory infection, unspecified: Secondary | ICD-10-CM | POA: Diagnosis not present

## 2021-12-22 MED ORDER — CEFDINIR 300 MG PO CAPS: 300.0000 mg | ORAL_CAPSULE | Freq: Two times a day (BID) | ORAL | 0 refills | Status: DC

## 2021-12-22 NOTE — Progress Notes (Signed)
?  ? ?Subjective:  ?Patient ID: Summer Spencer, female    DOB: May 19, 1991, 31 y.o.   MRN: [id number] ? ?Patient Care Team: ?Chevis Pretty, FNP as PCP - General (Family Medicine)  ? ?Chief Complaint:  URI ? ? ?HPI: ?Summer Spencer is a 31 y.o. female presenting on 12/22/2021 for URI ? ? ?Pt presents today with ongoing URI symptoms since Friday. Husband was recently diagnosed with pneumonia. Pt denies wheezing or productive cough.  ? ?URI  ?This is a new problem. The current episode started in the past 7 days. The problem has been gradually worsening. Associated symptoms include congestion, coughing, headaches, rhinorrhea, sinus pain and a sore throat. Pertinent negatives include no abdominal pain, chest pain, diarrhea, dysuria, ear pain, joint pain, joint swelling, nausea, neck pain, plugged ear sensation, rash, sneezing, swollen glands, vomiting or wheezing. She has tried decongestant and antihistamine for the symptoms. The treatment provided no relief.  ? ? ?Relevant past medical, surgical, family, and social history reviewed and updated as indicated.  ?Allergies and medications reviewed and updated. Data reviewed: Chart in Epic. ? ? ?Past Medical History:  ?Diagnosis Date  ? Bronchitis   ? Diabetes mellitus without complication ([place])   ? Hypoglycemia   ? ? ?Past Surgical History:  ?Procedure Laterality Date  ? hymen    ? WISDOM TOOTH EXTRACTION    ? ? ?Social History  ? ?Socioeconomic History  ? Marital status: Married  ?  Spouse name: Not on file  ? Number of children: Not on file  ? Years of education: Not on file  ? Highest education level: Not on file  ?Occupational History  ? Not on file  ?Tobacco Use  ? Smoking status: Former  ?  Packs/day: 0.25  ?  Years: 2.00  ?  Pack years: 0.50  ?  Types: Cigarettes  ?  Quit date: 12/28/2016  ?  Years since quitting: 4.9  ? Smokeless tobacco: Never  ?Vaping Use  ? Vaping Use: Never used  ?Substance and Sexual Activity  ? Alcohol use: Yes  ?  Alcohol/week: 1.0  standard drink  ?  Types: 1 Standard drinks or equivalent per week  ? Drug use: No  ? Sexual activity: Yes  ?Other Topics Concern  ? Not on file  ?Social History Narrative  ? Not on file  ? ?Social Determinants of Health  ? ?Financial Resource Strain: Not on file  ?Food Insecurity: Not on file  ?Transportation Needs: Not on file  ?Physical Activity: Not on file  ?Stress: Not on file  ?Social Connections: Not on file  ?Intimate Partner Violence: Not on file  ? ? ?Outpatient Encounter Medications as of 12/22/2021  ?Medication Sig  ? cefdinir (OMNICEF) 300 MG capsule Take 1 capsule (300 mg total) by mouth 2 (two) times daily. 1 po BID  ? Blood Glucose Monitoring Suppl (FIFTY50 GLUCOSE METER 2.0) w/Device KIT Use as instructed - Contour Next EZ Meter  ? Continuous Blood Gluc Receiver (DEXCOM G6 RECEIVER) DEVI Use as directed for continuous glucose monitoring  ? Continuous Blood Gluc Sensor (DEXCOM G6 SENSOR) MISC Use as directed for 10 days for continuous glucose monitoring  ? Continuous Blood Gluc Transmit (DEXCOM G6 TRANSMITTER) MISC Use as directed for continuous glucose monitoring.  Replace every 90 days  ? fluconazole (DIFLUCAN) 100 MG tablet Take 1 tablet (100 mg total) by mouth daily.  ? glucose blood (CONTOUR NEXT TEST) test strip Use to test blood sugar 4 times a day (Dx:  E10.65)  ? insulin aspart (NOVOLOG FLEXPEN) 100 UNIT/ML FlexPen Breakfast and Lunch: Inject 1 unit per 10 grams of carbs PLUS 1 unit per 40 mg/dl above 120 mg/dl.  Supper:  Inject 1 unit per 8 grams of carbs PLUS 1 unit per 40 mg/dl above 120 mg/dl.  ? insulin aspart (NOVOLOG) 100 UNIT/ML FlexPen Inject as directed before each meal with 1 unit per 10 grams of carbs PLUS 1 unit per 40 mg/dl above 120 mg/dl or as directed for diabetes  ? insulin degludec (TRESIBA FLEXTOUCH) 100 UNIT/ML FlexTouch Pen Inject 14 units at bedtime daily for diabetes  ? insulin degludec (TRESIBA FLEXTOUCH) 100 UNIT/ML SOPN FlexTouch Pen Inject 18 Units into the skin  at bedtime.   ? ?No facility-administered encounter medications on file as of 12/22/2021.  ? ? ?Allergies  ?Allergen Reactions  ? Metronidazole Rash  ? Septra [Bactrim] Rash  ? Sulfamethoxazole Rash  ? Sulfamethoxazole-Trimethoprim Rash  ? ? ?Review of Systems  ?Constitutional:  Positive for chills. Negative for activity change, appetite change, diaphoresis, fatigue, fever and unexpected weight change.  ?HENT:  Positive for congestion, postnasal drip, rhinorrhea, sinus pain and sore throat. Negative for dental problem, drooling, ear discharge, ear pain, facial swelling, hearing loss, mouth sores, nosebleeds, sinus pressure, sneezing, tinnitus, trouble swallowing and voice change.   ?Respiratory:  Positive for cough. Negative for apnea, choking, chest tightness, shortness of breath, wheezing and stridor.   ?Cardiovascular:  Negative for chest pain.  ?Gastrointestinal:  Negative for abdominal pain, diarrhea, nausea and vomiting.  ?Genitourinary:  Negative for decreased urine volume, difficulty urinating and dysuria.  ?Musculoskeletal:  Negative for arthralgias, joint pain, myalgias and neck pain.  ?Skin:  Negative for rash.  ?Neurological:  Positive for headaches. Negative for dizziness, tremors, seizures, syncope, facial asymmetry, speech difficulty, weakness, light-headedness and numbness.  ?Psychiatric/Behavioral:  Negative for confusion.   ?All other systems reviewed and are negative. ? ?   ? ?Objective:  ?BP 114/77   Pulse 82   Temp 98.3 ?F (36.8 ?C) (Temporal)   Ht 5' 2" (1.575 m)   Wt 124 lb (56.2 kg)   BMI 22.68 kg/m?   ? ?Wt Readings from Last 3 Encounters:  ?12/22/21 124 lb (56.2 kg)  ?11/27/21 124 lb (56.2 kg)  ?10/12/18 120 lb (54.4 kg)  ? ? ?Physical Exam ?Vitals and nursing note reviewed.  ?Constitutional:   ?   General: She is not in acute distress. ?   Appearance: Normal appearance. She is well-developed, well-groomed and normal weight. She is not ill-appearing, toxic-appearing or diaphoretic.   ?HENT:  ?   Head: Normocephalic and atraumatic.  ?   Jaw: There is normal jaw occlusion.  ?   Right Ear: Hearing, tympanic membrane, ear canal and external ear normal.  ?   Left Ear: Hearing, tympanic membrane, ear canal and external ear normal.  ?   Nose: Congestion and rhinorrhea present.  ?   Mouth/Throat:  ?   Lips: Pink.  ?   Mouth: Mucous membranes are moist.  ?   Pharynx: Oropharynx is clear. Uvula midline. Posterior oropharyngeal erythema present. No pharyngeal swelling, oropharyngeal exudate or uvula swelling.  ?   Tonsils: No tonsillar exudate or tonsillar abscesses.  ?Eyes:  ?   General: Lids are normal.  ?   Extraocular Movements: Extraocular movements intact.  ?   Conjunctiva/sclera: Conjunctivae normal.  ?   Pupils: Pupils are equal, round, and reactive to light.  ?Neck:  ?   Thyroid: No thyroid mass,  thyromegaly or thyroid tenderness.  ?   Vascular: No carotid bruit or JVD.  ?   Trachea: Trachea and phonation normal.  ?Cardiovascular:  ?   Rate and Rhythm: Normal rate and regular rhythm.  ?   Chest Wall: PMI is not displaced.  ?   Pulses: Normal pulses.  ?   Heart sounds: Normal heart sounds. No murmur heard. ?  No friction rub. No gallop.  ?Pulmonary:  ?   Effort: Pulmonary effort is normal. No respiratory distress.  ?   Breath sounds: Normal breath sounds. No wheezing or rhonchi.  ?Abdominal:  ?   General: Bowel sounds are normal. There is no abdominal bruit.  ?   Palpations: Abdomen is soft. There is no hepatomegaly or splenomegaly.  ?Musculoskeletal:     ?   General: Normal range of motion.  ?   Cervical back: Normal range of motion and neck supple.  ?   Right lower leg: No edema.  ?   Left lower leg: No edema.  ?Lymphadenopathy:  ?   Cervical: No cervical adenopathy.  ?Skin: ?   General: Skin is warm and dry.  ?   Capillary Refill: Capillary refill takes less than 2 seconds.  ?   Coloration: Skin is not cyanotic, jaundiced or pale.  ?   Findings: No rash.  ?Neurological:  ?   General: No focal  deficit present.  ?   Mental Status: She is alert and oriented to person, place, and time.  ?   Sensory: Sensation is intact.  ?   Motor: Motor function is intact.  ?   Coordination: Coordination is intact.  ?   Gaynelle Arabian

## 2021-12-24 DIAGNOSIS — E1065 Type 1 diabetes mellitus with hyperglycemia: Secondary | ICD-10-CM | POA: Diagnosis not present

## 2021-12-25 DIAGNOSIS — E1065 Type 1 diabetes mellitus with hyperglycemia: Secondary | ICD-10-CM | POA: Diagnosis not present

## 2021-12-25 DIAGNOSIS — E10649 Type 1 diabetes mellitus with hypoglycemia without coma: Secondary | ICD-10-CM | POA: Diagnosis not present

## 2021-12-29 ENCOUNTER — Other Ambulatory Visit: Payer: Self-pay | Admitting: Nurse Practitioner

## 2021-12-29 MED ORDER — FLUCONAZOLE 150 MG PO TABS: ORAL_TABLET | ORAL | 0 refills | Status: DC

## 2021-12-29 NOTE — Progress Notes (Signed)
Diflucan sent to pharmacy.

## 2022-01-05 ENCOUNTER — Other Ambulatory Visit: Payer: Self-pay | Admitting: Nurse Practitioner

## 2022-01-05 MED ORDER — FLUCONAZOLE 150 MG PO TABS: 150.0000 mg | ORAL_TABLET | Freq: Once | ORAL | 0 refills | Status: AC

## 2022-01-05 NOTE — Telephone Encounter (Signed)
Nystatin swish and swallow sent to pharmacy

## 2022-03-26 DIAGNOSIS — E1065 Type 1 diabetes mellitus with hyperglycemia: Secondary | ICD-10-CM | POA: Diagnosis not present

## 2022-03-26 DIAGNOSIS — E10649 Type 1 diabetes mellitus with hypoglycemia without coma: Secondary | ICD-10-CM | POA: Diagnosis not present

## 2022-03-30 DIAGNOSIS — E559 Vitamin D deficiency, unspecified: Secondary | ICD-10-CM | POA: Diagnosis not present

## 2022-03-30 DIAGNOSIS — E1065 Type 1 diabetes mellitus with hyperglycemia: Secondary | ICD-10-CM | POA: Diagnosis not present

## 2022-03-31 DIAGNOSIS — E559 Vitamin D deficiency, unspecified: Secondary | ICD-10-CM | POA: Insufficient documentation

## 2022-04-02 DIAGNOSIS — R21 Rash and other nonspecific skin eruption: Secondary | ICD-10-CM | POA: Diagnosis not present

## 2022-05-04 DIAGNOSIS — Z6823 Body mass index (BMI) 23.0-23.9, adult: Secondary | ICD-10-CM | POA: Diagnosis not present

## 2022-05-04 DIAGNOSIS — Z01419 Encounter for gynecological examination (general) (routine) without abnormal findings: Secondary | ICD-10-CM | POA: Diagnosis not present

## 2022-07-01 DIAGNOSIS — E10649 Type 1 diabetes mellitus with hypoglycemia without coma: Secondary | ICD-10-CM | POA: Diagnosis not present

## 2022-07-01 DIAGNOSIS — E1065 Type 1 diabetes mellitus with hyperglycemia: Secondary | ICD-10-CM | POA: Diagnosis not present

## 2022-07-09 DIAGNOSIS — E10649 Type 1 diabetes mellitus with hypoglycemia without coma: Secondary | ICD-10-CM | POA: Diagnosis not present

## 2022-07-09 DIAGNOSIS — Z9641 Presence of insulin pump (external) (internal): Secondary | ICD-10-CM | POA: Diagnosis not present

## 2022-07-27 DIAGNOSIS — Z9641 Presence of insulin pump (external) (internal): Secondary | ICD-10-CM | POA: Insufficient documentation

## 2022-09-21 ENCOUNTER — Emergency Department (HOSPITAL_BASED_OUTPATIENT_CLINIC_OR_DEPARTMENT_OTHER): Payer: BC Managed Care – PPO

## 2022-09-21 ENCOUNTER — Inpatient Hospital Stay (HOSPITAL_BASED_OUTPATIENT_CLINIC_OR_DEPARTMENT_OTHER)
Admission: EM | Admit: 2022-09-21 | Discharge: 2022-09-24 | DRG: 638 | Disposition: A | Discharge status: Home / Self Care | Payer: BC Managed Care – PPO | Attending: Internal Medicine | Admitting: Internal Medicine

## 2022-09-21 ENCOUNTER — Encounter (HOSPITAL_BASED_OUTPATIENT_CLINIC_OR_DEPARTMENT_OTHER): Payer: Self-pay

## 2022-09-21 ENCOUNTER — Other Ambulatory Visit: Payer: Self-pay

## 2022-09-21 DIAGNOSIS — Z83438 Family history of other disorder of lipoprotein metabolism and other lipidemia: Secondary | ICD-10-CM | POA: Diagnosis not present

## 2022-09-21 DIAGNOSIS — N179 Acute kidney failure, unspecified: Secondary | ICD-10-CM | POA: Diagnosis present

## 2022-09-21 DIAGNOSIS — Z1152 Encounter for screening for COVID-19: Secondary | ICD-10-CM | POA: Diagnosis not present

## 2022-09-21 DIAGNOSIS — T383X6A Underdosing of insulin and oral hypoglycemic [antidiabetic] drugs, initial encounter: Secondary | ICD-10-CM | POA: Diagnosis not present

## 2022-09-21 DIAGNOSIS — Z888 Allergy status to other drugs, medicaments and biological substances status: Secondary | ICD-10-CM

## 2022-09-21 DIAGNOSIS — R000 Tachycardia, unspecified: Secondary | ICD-10-CM | POA: Diagnosis not present

## 2022-09-21 DIAGNOSIS — Z882 Allergy status to sulfonamides status: Secondary | ICD-10-CM

## 2022-09-21 DIAGNOSIS — Z833 Family history of diabetes mellitus: Secondary | ICD-10-CM | POA: Diagnosis not present

## 2022-09-21 DIAGNOSIS — Z87891 Personal history of nicotine dependence: Secondary | ICD-10-CM

## 2022-09-21 DIAGNOSIS — E875 Hyperkalemia: Secondary | ICD-10-CM | POA: Diagnosis not present

## 2022-09-21 DIAGNOSIS — Z794 Long term (current) use of insulin: Secondary | ICD-10-CM

## 2022-09-21 DIAGNOSIS — Z8249 Family history of ischemic heart disease and other diseases of the circulatory system: Secondary | ICD-10-CM | POA: Diagnosis not present

## 2022-09-21 DIAGNOSIS — B349 Viral infection, unspecified: Secondary | ICD-10-CM | POA: Diagnosis not present

## 2022-09-21 DIAGNOSIS — E1010 Type 1 diabetes mellitus with ketoacidosis without coma: Secondary | ICD-10-CM | POA: Diagnosis not present

## 2022-09-21 DIAGNOSIS — E876 Hypokalemia: Secondary | ICD-10-CM | POA: Diagnosis not present

## 2022-09-21 DIAGNOSIS — Z91148 Patient's other noncompliance with medication regimen for other reason: Secondary | ICD-10-CM

## 2022-09-21 DIAGNOSIS — R918 Other nonspecific abnormal finding of lung field: Secondary | ICD-10-CM | POA: Diagnosis not present

## 2022-09-21 LAB — URINALYSIS, ROUTINE W REFLEX MICROSCOPIC
Bilirubin Urine: NEGATIVE
Glucose, UA: >=500 mg/dL — AB
Ketones, ur: >=80 mg/dL — AB
Leukocytes,Ua: NEGATIVE
Nitrite: NEGATIVE
Protein, ur: 30 mg/dL — AB
Specific Gravity, Urine: >=1.03 (ref 1.005–1.030)
pH: 5 (ref 5.0–8.0)

## 2022-09-21 LAB — URINALYSIS, MICROSCOPIC (REFLEX): WBC, UA: NONE SEEN WBC/hpf (ref 0–5)

## 2022-09-21 LAB — I-STAT VENOUS BLOOD GAS, ED
Acid-base deficit: 24 mmol/L — ABNORMAL HIGH (ref 0.0–2.0)
Bicarbonate: 5.5 mmol/L — ABNORMAL LOW (ref 20.0–28.0)
Calcium, Ion: 1.22 mmol/L (ref 1.15–1.40)
HCT: 43 % (ref 36.0–46.0)
Hemoglobin: 14.6 g/dL (ref 12.0–15.0)
O2 Saturation: 89 %
Patient temperature: 98
Potassium: 6.3 mmol/L (ref 3.5–5.1)
Sodium: 133 mmol/L — ABNORMAL LOW (ref 135–145)
TCO2: 6 mmol/L — ABNORMAL LOW (ref 22–32)
pCO2, Ven: 21.4 mmHg — ABNORMAL LOW (ref 44–60)
pH, Ven: 7.018 — CL (ref 7.25–7.43)
pO2, Ven: 80 mmHg — ABNORMAL HIGH (ref 32–45)

## 2022-09-21 LAB — CBG MONITORING, ED
Glucose-Capillary: 382 mg/dL — ABNORMAL HIGH (ref 70–99)
Glucose-Capillary: 396 mg/dL — ABNORMAL HIGH (ref 70–99)

## 2022-09-21 LAB — COMPREHENSIVE METABOLIC PANEL
ALT: 29 U/L (ref 0–44)
AST: 25 U/L (ref 15–41)
Albumin: 5 g/dL (ref 3.5–5.0)
Alkaline Phosphatase: 78 U/L (ref 38–126)
Anion gap: 19 — ABNORMAL HIGH (ref 5–15)
BUN: 20 mg/dL (ref 6–20)
CO2: 8 mmol/L — ABNORMAL LOW (ref 22–32)
Calcium: 9 mg/dL (ref 8.9–10.3)
Chloride: 103 mmol/L (ref 98–111)
Creatinine, Ser: 1.1 mg/dL — ABNORMAL HIGH (ref 0.44–1.00)
GFR, Estimated: >60 mL/min (ref >60)
Glucose, Bld: 439 mg/dL — ABNORMAL HIGH (ref 70–99)
Potassium: 6.3 mmol/L (ref 3.5–5.1)
Sodium: 130 mmol/L — ABNORMAL LOW (ref 135–145)
Total Bilirubin: 1 mg/dL (ref 0.3–1.2)
Total Protein: 8.4 g/dL — ABNORMAL HIGH (ref 6.5–8.1)

## 2022-09-21 LAB — CBC
HCT: 45.9 % (ref 36.0–46.0)
Hemoglobin: 15.4 g/dL — ABNORMAL HIGH (ref 12.0–15.0)
MCH: 32 pg (ref 26.0–34.0)
MCHC: 33.6 g/dL (ref 30.0–36.0)
MCV: 95.4 fL (ref 80.0–100.0)
Platelets: 209 10*3/uL (ref 150–400)
RBC: 4.81 MIL/uL (ref 3.87–5.11)
RDW: 11.8 % (ref 11.5–15.5)
WBC: 18.7 10*3/uL — ABNORMAL HIGH (ref 4.0–10.5)
nRBC: 0 % (ref 0.0–0.2)

## 2022-09-21 LAB — PREGNANCY, URINE: Preg Test, Ur: NEGATIVE

## 2022-09-21 LAB — LIPASE, BLOOD: Lipase: 25 U/L (ref 11–51)

## 2022-09-21 MED ORDER — LACTATED RINGERS IV SOLN: INTRAVENOUS | Status: DC

## 2022-09-21 MED ORDER — LACTATED RINGERS IV BOLUS
20.0000 mL/kg | Freq: Once | INTRAVENOUS | Status: AC
  Administered 2022-09-21: 1270 mL via INTRAVENOUS

## 2022-09-21 MED ORDER — INSULIN REGULAR(HUMAN) IN NACL 100-0.9 UT/100ML-% IV SOLN
INTRAVENOUS | Status: DC
  Administered 2022-09-21: 7.5 [IU]/h via INTRAVENOUS
  Administered 2022-09-22: 1.7 [IU]/h via INTRAVENOUS
  Filled 2022-09-21 (×2): qty 100

## 2022-09-21 MED ORDER — DEXTROSE 50 % IV SOLN: 0.0000 mL | INTRAVENOUS | Status: DC | PRN

## 2022-09-21 MED ORDER — DIPHENHYDRAMINE HCL 50 MG/ML IJ SOLN
25.0000 mg | Freq: Once | INTRAMUSCULAR | Status: AC
  Administered 2022-09-21: 25 mg via INTRAVENOUS
  Filled 2022-09-21: qty 1

## 2022-09-21 MED ORDER — DEXTROSE IN LACTATED RINGERS 5 % IV SOLN: INTRAVENOUS | Status: DC

## 2022-09-21 MED ORDER — ONDANSETRON HCL 4 MG/2ML IJ SOLN
4.0000 mg | Freq: Once | INTRAMUSCULAR | Status: AC
  Administered 2022-09-21: 4 mg via INTRAVENOUS
  Filled 2022-09-21: qty 2

## 2022-09-21 MED ORDER — METOCLOPRAMIDE HCL 5 MG/ML IJ SOLN
10.0000 mg | Freq: Once | INTRAMUSCULAR | Status: AC
  Administered 2022-09-21: 10 mg via INTRAVENOUS
  Filled 2022-09-21: qty 2

## 2022-09-21 NOTE — ED Provider Notes (Addendum)
[facility] Provider Note   CSN: [id number] Arrival date & time: 09/21/22  1953     History  Chief Complaint  Patient presents with   Emesis    Summer Spencer is a 32 y.o. female.  Patient here with nausea vomiting.  History of diabetes.  She has been needing to use insulin pen as due to insurance issues and many issues she does not have her insulin pump.  She has been without this for about a day.  Her child has had some viral symptoms but she denies any fevers or chills.  No cough or sputum production.  No pain with urination.  She thought she was doing a good job using sliding scale with her insulin pen and her sugar checks at home.  However she noticed that her blood sugars have gotten worse.  Now she is having uncontrollable nausea and vomiting.  She denies history of gastroparesis.  She is not having any abdominal pain.    The history is provided by the patient.       Home Medications Prior to Admission medications   Medication Sig Start Date End Date Taking? Authorizing Provider  Blood Glucose Monitoring Suppl (FIFTY50 GLUCOSE METER 2.0) w/Device KIT Use as instructed - Contour Next EZ Meter 03/03/17   [provider]  cefdinir (OMNICEF) 300 MG capsule Take 1 capsule (300 mg total) by mouth 2 (two) times daily. 1 po BID 12/22/21   Baruch Gouty, FNP  Continuous Blood Gluc Receiver ([place]) DEVI Use as directed for continuous glucose monitoring 09/07/18   [provider]  Continuous Blood Gluc Sensor (DEXCOM G6 SENSOR) MISC Use as directed for 10 days for continuous glucose monitoring 10/25/19   [provider]  Continuous Blood Gluc Transmit (DEXCOM G6 TRANSMITTER) MISC Use as directed for continuous glucose monitoring.  Replace every 90 days 10/25/19   [provider]  glucose blood (CONTOUR NEXT TEST) test strip Use to test blood sugar 4 times a day (Dx:  E10.65) 07/12/19   [provider]  insulin aspart (NOVOLOG FLEXPEN) 100 UNIT/ML FlexPen Breakfast and Lunch: Inject 1 unit per 10 grams of carbs PLUS 1 unit per 40 mg/dl above 120 mg/dl.  Supper:  Inject 1 unit per 8 grams of carbs PLUS 1 unit per 40 mg/dl above 120 mg/dl. 10/25/19   [provider]  insulin aspart (NOVOLOG) 100 UNIT/ML FlexPen Inject as directed before each meal with 1 unit per 10 grams of carbs PLUS 1 unit per 40 mg/dl above 120 mg/dl or as directed for diabetes 03/12/17   [provider]  insulin degludec (TRESIBA FLEXTOUCH) 100 UNIT/ML FlexTouch Pen Inject 14 units at bedtime daily for diabetes 10/25/19   [provider]  insulin degludec (TRESIBA FLEXTOUCH) 100 UNIT/ML SOPN FlexTouch Pen Inject 18 Units into the skin at bedtime.  03/12/17   [provider]      Allergies    Metronidazole, Septra [bactrim], Sulfamethoxazole, and Sulfamethoxazole-trimethoprim    Review of Systems   Review of Systems  Physical Exam Updated Vital Signs BP 137/68 (BP Location: Left Arm)   Pulse (!) 107   Temp 98 F (36.7 C) (Oral)   Resp 20   Ht 5\' 2"  (1.575 m)   Wt 63.5 kg   LMP 09/05/2022 (Approximate)   SpO2 100%   BMI 25.61 kg/m  Physical Exam Vitals and nursing note reviewed.  Constitutional:      General:  She is not in acute distress.    Appearance: She is well-developed. She is ill-appearing.  HENT:     Head: Normocephalic and atraumatic.     Nose: Nose normal.     Mouth/Throat:     Mouth: Mucous membranes are dry.  Eyes:     Extraocular Movements: Extraocular movements intact.     Conjunctiva/sclera: Conjunctivae normal.     Pupils: Pupils are equal, round, and reactive to light.  Cardiovascular:     Rate and Rhythm: Normal rate and regular rhythm.     Pulses: Normal pulses.     Heart sounds: Normal heart sounds. No murmur heard. Pulmonary:     Effort: Pulmonary effort is normal. No respiratory distress.     Breath sounds: Normal breath sounds.   Abdominal:     Palpations: Abdomen is soft.     Tenderness: There is no abdominal tenderness.  Musculoskeletal:        General: No swelling.     Cervical back: Normal range of motion and neck supple.  Skin:    General: Skin is warm and dry.     Capillary Refill: Capillary refill takes less than 2 seconds.  Neurological:     Mental Status: She is alert.  Psychiatric:        Mood and Affect: Mood normal.     ED Results / Procedures / Treatments   Labs (all labs ordered are listed, but only abnormal results are displayed) Labs Reviewed  COMPREHENSIVE METABOLIC PANEL - Abnormal; Notable for the following components:      Result Value   Sodium 130 (*)    Potassium 6.3 (*)    CO2 8 (*)    Glucose, Bld 439 (*)    Creatinine, Ser 1.10 (*)    Total Protein 8.4 (*)    Anion gap 19 (*)    All other components within normal limits  CBC - Abnormal; Notable for the following components:   WBC 18.7 (*)    Hemoglobin 15.4 (*)    All other components within normal limits  URINALYSIS, ROUTINE W REFLEX MICROSCOPIC - Abnormal; Notable for the following components:   Glucose, UA >=500 (*)    Hgb urine dipstick SMALL (*)    Ketones, ur >=80 (*)    Protein, ur 30 (*)    All other components within normal limits  URINALYSIS, MICROSCOPIC (REFLEX) - Abnormal; Notable for the following components:   Bacteria, UA RARE (*)    All other components within normal limits  CBG MONITORING, ED - Abnormal; Notable for the following components:   Glucose-Capillary 382 (*)    All other components within normal limits  CBG MONITORING, ED - Abnormal; Notable for the following components:   Glucose-Capillary 396 (*)    All other components within normal limits  RESP PANEL BY RT-PCR (RSV, FLU A&B, COVID)  RVPGX2  LIPASE, BLOOD  PREGNANCY, URINE  BLOOD GAS, VENOUS  BETA-HYDROXYBUTYRIC ACID    EKG None  Radiology DG Chest Portable 1 View  Result Date: 09/21/2022 CLINICAL DATA:  Suspected better  ketoacidosis. EXAM: PORTABLE CHEST 1 VIEW COMPARISON:  None Available. FINDINGS: The heart size and mediastinal contours are within normal limits. Both lungs are hyperinflated but clear. The visualized skeletal structures are unremarkable. IMPRESSION: No acute radiographic chest findings.  Hyperinflated chest. Electronically Signed   By: Telford Nab M.D.   On: 09/21/2022 22:43    Procedures .Critical Care  Performed by: Lennice Sites, DO Authorized by: Lennice Sites, DO  Critical care provider statement:    Critical care time (minutes):  35   Critical care was necessary to treat or prevent imminent or life-threatening deterioration of the following conditions:  Endocrine crisis   Critical care was time spent personally by me on the following activities:  Blood draw for specimens, development of treatment plan with patient or surrogate, discussions with primary provider, evaluation of patient's response to treatment, examination of patient, obtaining history from patient or surrogate, ordering and performing treatments and interventions, ordering and review of laboratory studies, ordering and review of radiographic studies, pulse oximetry, re-evaluation of patient's condition and review of old charts   I assumed direction of critical care for this patient from another provider in my specialty: no       Medications Ordered in ED Medications  insulin regular, human (MYXREDLIN) 100 units/ 100 mL infusion (7.5 Units/hr Intravenous New Bag/Given 09/21/22 2306)  lactated ringers infusion (has no administration in time range)  dextrose 5 % in lactated ringers infusion (has no administration in time range)  dextrose 50 % solution 0-50 mL (has no administration in time range)  lactated ringers bolus 1,270 mL (1,270 mLs Intravenous New Bag/Given 09/21/22 2226)  ondansetron (ZOFRAN) injection 4 mg (4 mg Intravenous Given 09/21/22 2227)  metoCLOPramide (REGLAN) injection 10 mg (10 mg Intravenous Given  09/21/22 2245)  diphenhydrAMINE (BENADRYL) injection 25 mg (25 mg Intravenous Given 09/21/22 2245)    ED Course/ Medical Decision Making/ A&P                             Medical Decision Making Amount and/or Complexity of Data Reviewed Labs: ordered. Radiology: ordered.  Risk Prescription drug management. Decision regarding hospitalization.   Summer Spencer is here with nausea and vomiting and elevated blood sugar.  Differential diagnosis is DKA likely in the setting of not using her insulin pump and having to use sliding scale insulin pen the last day.  May be viral symptoms causing this as well.  Son has some viral symptoms.  She denies any cough or sputum production.  No pain with urination.  Blood sugar elevated in the 400s upon arrival here.  DKA labs ordered including CBC, CBC, blood gas, EKG, urinalysis.  She is got ketones in her urine per my review interpretation of labs.  Her bicarb is 8, anion gap is 19, blood sugar is 439, potassium of 6.3.  EKG shows no hyperkalemic changes.  Normal sinus rhythm otherwise.  Patient started on IV fluid bolus and infusion.  Insulin bolus and infusion started as well per DKA protocol.  She does not appear to have a urinary tract infection.  No pneumonia or pneumothorax on chest x-ray.  COVID and flu test pending.  She is not having any abdominal tenderness.  Will give her IV Reglan and Benadryl for nausea and vomiting.  Overall we will admit for further DKA care.  Blood Gas is 7.0.  EKG showed no hyperkalemic changes.  Per my review interpretation  This chart was dictated using voice recognition software.  Despite best efforts to proofread,  errors can occur which can change the documentation meaning.         Final Clinical Impression(s) / ED Diagnoses Final diagnoses:  Diabetic ketoacidosis without coma associated with type 1 diabetes mellitus (HCC)    Rx / DC Orders ED Discharge Orders     None         Virgina Norfolk,  DO 09/21/22 2255    Virgina Norfolk, DO 09/21/22 2323

## 2022-09-21 NOTE — ED Triage Notes (Addendum)
Pt reports nausea and vomiting, onset this morning when she woke up. She has type 1 diabetes and her blood sugars have been high today, last one was 330. She denies hx of DKA. She reports generalized body aches and a headache. She took ODT Zofran today about 1 hour ago, a total of 16mg  today since 12pm.   Emesis x4 today.

## 2022-09-22 DIAGNOSIS — Z888 Allergy status to other drugs, medicaments and biological substances status: Secondary | ICD-10-CM | POA: Diagnosis not present

## 2022-09-22 DIAGNOSIS — E1010 Type 1 diabetes mellitus with ketoacidosis without coma: Secondary | ICD-10-CM

## 2022-09-22 DIAGNOSIS — N179 Acute kidney failure, unspecified: Secondary | ICD-10-CM | POA: Diagnosis present

## 2022-09-22 DIAGNOSIS — Z794 Long term (current) use of insulin: Secondary | ICD-10-CM | POA: Diagnosis not present

## 2022-09-22 DIAGNOSIS — B349 Viral infection, unspecified: Secondary | ICD-10-CM | POA: Diagnosis present

## 2022-09-22 DIAGNOSIS — Z1152 Encounter for screening for COVID-19: Secondary | ICD-10-CM | POA: Diagnosis not present

## 2022-09-22 DIAGNOSIS — E875 Hyperkalemia: Secondary | ICD-10-CM | POA: Diagnosis present

## 2022-09-22 DIAGNOSIS — Z882 Allergy status to sulfonamides status: Secondary | ICD-10-CM | POA: Diagnosis not present

## 2022-09-22 DIAGNOSIS — Z833 Family history of diabetes mellitus: Secondary | ICD-10-CM | POA: Diagnosis not present

## 2022-09-22 DIAGNOSIS — T383X6A Underdosing of insulin and oral hypoglycemic [antidiabetic] drugs, initial encounter: Secondary | ICD-10-CM | POA: Diagnosis present

## 2022-09-22 DIAGNOSIS — Z91148 Patient's other noncompliance with medication regimen for other reason: Secondary | ICD-10-CM | POA: Diagnosis not present

## 2022-09-22 DIAGNOSIS — Z8249 Family history of ischemic heart disease and other diseases of the circulatory system: Secondary | ICD-10-CM | POA: Diagnosis not present

## 2022-09-22 DIAGNOSIS — E876 Hypokalemia: Secondary | ICD-10-CM | POA: Diagnosis not present

## 2022-09-22 DIAGNOSIS — Z87891 Personal history of nicotine dependence: Secondary | ICD-10-CM | POA: Diagnosis not present

## 2022-09-22 DIAGNOSIS — Z83438 Family history of other disorder of lipoprotein metabolism and other lipidemia: Secondary | ICD-10-CM | POA: Diagnosis not present

## 2022-09-22 LAB — BASIC METABOLIC PANEL
Anion gap: 9 (ref 5–15)
Anion gap: 6 (ref 5–15)
Anion gap: 7 (ref 5–15)
Anion gap: 9 (ref 5–15)
Anion gap: 5 (ref 5–15)
Anion gap: 8 (ref 5–15)
BUN: 14 mg/dL (ref 6–20)
BUN: 14 mg/dL (ref 6–20)
BUN: 15 mg/dL (ref 6–20)
BUN: 16 mg/dL (ref 6–20)
BUN: 16 mg/dL (ref 6–20)
BUN: 17 mg/dL (ref 6–20)
BUN: 19 mg/dL (ref 6–20)
CO2: 11 mmol/L — ABNORMAL LOW (ref 22–32)
CO2: 14 mmol/L — ABNORMAL LOW (ref 22–32)
CO2: 14 mmol/L — ABNORMAL LOW (ref 22–32)
CO2: 16 mmol/L — ABNORMAL LOW (ref 22–32)
CO2: 17 mmol/L — ABNORMAL LOW (ref 22–32)
CO2: 18 mmol/L — ABNORMAL LOW (ref 22–32)
CO2: <7 mmol/L — ABNORMAL LOW (ref 22–32)
Calcium: 7.8 mg/dL — ABNORMAL LOW (ref 8.9–10.3)
Calcium: 7.9 mg/dL — ABNORMAL LOW (ref 8.9–10.3)
Calcium: 8.1 mg/dL — ABNORMAL LOW (ref 8.9–10.3)
Calcium: 8.1 mg/dL — ABNORMAL LOW (ref 8.9–10.3)
Calcium: 8.4 mg/dL — ABNORMAL LOW (ref 8.9–10.3)
Calcium: 8.5 mg/dL — ABNORMAL LOW (ref 8.9–10.3)
Calcium: 8.8 mg/dL — ABNORMAL LOW (ref 8.9–10.3)
Chloride: 106 mmol/L (ref 98–111)
Chloride: 109 mmol/L (ref 98–111)
Chloride: 110 mmol/L (ref 98–111)
Chloride: 113 mmol/L — ABNORMAL HIGH (ref 98–111)
Chloride: 113 mmol/L — ABNORMAL HIGH (ref 98–111)
Chloride: 113 mmol/L — ABNORMAL HIGH (ref 98–111)
Chloride: 114 mmol/L — ABNORMAL HIGH (ref 98–111)
Creatinine, Ser: 0.75 mg/dL (ref 0.44–1.00)
Creatinine, Ser: 0.75 mg/dL (ref 0.44–1.00)
Creatinine, Ser: 0.77 mg/dL (ref 0.44–1.00)
Creatinine, Ser: 0.79 mg/dL (ref 0.44–1.00)
Creatinine, Ser: 0.83 mg/dL (ref 0.44–1.00)
Creatinine, Ser: 0.94 mg/dL (ref 0.44–1.00)
Creatinine, Ser: 1.12 mg/dL — ABNORMAL HIGH (ref 0.44–1.00)
GFR, Estimated: >60 mL/min (ref >60)
GFR, Estimated: >60 mL/min (ref >60)
GFR, Estimated: >60 mL/min (ref >60)
GFR, Estimated: >60 mL/min (ref >60)
GFR, Estimated: >60 mL/min (ref >60)
GFR, Estimated: >60 mL/min (ref >60)
GFR, Estimated: >60 mL/min (ref >60)
Glucose, Bld: 131 mg/dL — ABNORMAL HIGH (ref 70–99)
Glucose, Bld: 140 mg/dL — ABNORMAL HIGH (ref 70–99)
Glucose, Bld: 198 mg/dL — ABNORMAL HIGH (ref 70–99)
Glucose, Bld: 219 mg/dL — ABNORMAL HIGH (ref 70–99)
Glucose, Bld: 222 mg/dL — ABNORMAL HIGH (ref 70–99)
Glucose, Bld: 367 mg/dL — ABNORMAL HIGH (ref 70–99)
Glucose, Bld: 371 mg/dL — ABNORMAL HIGH (ref 70–99)
Potassium: 3.1 mmol/L — ABNORMAL LOW (ref 3.5–5.1)
Potassium: 3.5 mmol/L (ref 3.5–5.1)
Potassium: 3.7 mmol/L (ref 3.5–5.1)
Potassium: 4.1 mmol/L (ref 3.5–5.1)
Potassium: 4.4 mmol/L (ref 3.5–5.1)
Potassium: 4.9 mmol/L (ref 3.5–5.1)
Potassium: 6 mmol/L — ABNORMAL HIGH (ref 3.5–5.1)
Sodium: 131 mmol/L — ABNORMAL LOW (ref 135–145)
Sodium: 131 mmol/L — ABNORMAL LOW (ref 135–145)
Sodium: 133 mmol/L — ABNORMAL LOW (ref 135–145)
Sodium: 133 mmol/L — ABNORMAL LOW (ref 135–145)
Sodium: 134 mmol/L — ABNORMAL LOW (ref 135–145)
Sodium: 135 mmol/L (ref 135–145)
Sodium: 139 mmol/L (ref 135–145)

## 2022-09-22 LAB — CBG MONITORING, ED
Glucose-Capillary: 152 mg/dL — ABNORMAL HIGH (ref 70–99)
Glucose-Capillary: 166 mg/dL — ABNORMAL HIGH (ref 70–99)
Glucose-Capillary: 190 mg/dL — ABNORMAL HIGH (ref 70–99)
Glucose-Capillary: 195 mg/dL — ABNORMAL HIGH (ref 70–99)
Glucose-Capillary: 195 mg/dL — ABNORMAL HIGH (ref 70–99)
Glucose-Capillary: 198 mg/dL — ABNORMAL HIGH (ref 70–99)
Glucose-Capillary: 208 mg/dL — ABNORMAL HIGH (ref 70–99)
Glucose-Capillary: 209 mg/dL — ABNORMAL HIGH (ref 70–99)
Glucose-Capillary: 224 mg/dL — ABNORMAL HIGH (ref 70–99)
Glucose-Capillary: 271 mg/dL — ABNORMAL HIGH (ref 70–99)
Glucose-Capillary: 343 mg/dL — ABNORMAL HIGH (ref 70–99)
Glucose-Capillary: 349 mg/dL — ABNORMAL HIGH (ref 70–99)
Glucose-Capillary: 373 mg/dL — ABNORMAL HIGH (ref 70–99)

## 2022-09-22 LAB — GLUCOSE, CAPILLARY
Glucose-Capillary: 106 mg/dL — ABNORMAL HIGH (ref 70–99)
Glucose-Capillary: 130 mg/dL — ABNORMAL HIGH (ref 70–99)
Glucose-Capillary: 147 mg/dL — ABNORMAL HIGH (ref 70–99)
Glucose-Capillary: 202 mg/dL — ABNORMAL HIGH (ref 70–99)
Glucose-Capillary: 84 mg/dL (ref 70–99)

## 2022-09-22 LAB — HEMOGLOBIN A1C
Hgb A1c MFr Bld: 6.6 % — ABNORMAL HIGH (ref 4.8–5.6)
Mean Plasma Glucose: 142.72 mg/dL

## 2022-09-22 LAB — MAGNESIUM: Magnesium: 1.7 mg/dL (ref 1.7–2.4)

## 2022-09-22 LAB — CBC
HCT: 32.4 % — ABNORMAL LOW (ref 36.0–46.0)
Hemoglobin: 11.5 g/dL — ABNORMAL LOW (ref 12.0–15.0)
MCH: 32.5 pg (ref 26.0–34.0)
MCHC: 35.5 g/dL (ref 30.0–36.0)
MCV: 91.5 fL (ref 80.0–100.0)
Platelets: 147 10*3/uL — ABNORMAL LOW (ref 150–400)
RBC: 3.54 MIL/uL — ABNORMAL LOW (ref 3.87–5.11)
RDW: 11.9 % (ref 11.5–15.5)
WBC: 11.9 10*3/uL — ABNORMAL HIGH (ref 4.0–10.5)
nRBC: 0 % (ref 0.0–0.2)

## 2022-09-22 LAB — PHOSPHORUS: Phosphorus: 1.7 mg/dL — ABNORMAL LOW (ref 2.5–4.6)

## 2022-09-22 LAB — RESP PANEL BY RT-PCR (RSV, FLU A&B, COVID)  RVPGX2
Influenza A by PCR: NEGATIVE
Influenza B by PCR: NEGATIVE
Resp Syncytial Virus by PCR: NEGATIVE
SARS Coronavirus 2 by RT PCR: NEGATIVE

## 2022-09-22 LAB — MRSA NEXT GEN BY PCR, NASAL: MRSA by PCR Next Gen: NOT DETECTED

## 2022-09-22 LAB — BETA-HYDROXYBUTYRIC ACID
Beta-Hydroxybutyric Acid: 5.97 mmol/L — ABNORMAL HIGH (ref 0.05–0.27)
Beta-Hydroxybutyric Acid: 1.61 mmol/L — ABNORMAL HIGH (ref 0.05–0.27)

## 2022-09-22 MED ORDER — CHLORHEXIDINE GLUCONATE CLOTH 2 % EX PADS
6.0000 | MEDICATED_PAD | Freq: Every day | CUTANEOUS | Status: DC
  Administered 2022-09-22: 6 via TOPICAL

## 2022-09-22 MED ORDER — INSULIN GLARGINE-YFGN 100 UNIT/ML ~~LOC~~ SOLN
12.0000 [IU] | Freq: Once | SUBCUTANEOUS | Status: AC
  Administered 2022-09-22: 12 [IU] via SUBCUTANEOUS
  Filled 2022-09-22: qty 120

## 2022-09-22 MED ORDER — ACETAMINOPHEN 325 MG PO TABS
650.0000 mg | ORAL_TABLET | Freq: Once | ORAL | Status: AC
  Administered 2022-09-22: 650 mg via ORAL
  Filled 2022-09-22: qty 2

## 2022-09-22 MED ORDER — INSULIN ASPART 100 UNIT/ML IJ SOLN: 4.0000 [IU] | Freq: Three times a day (TID) | INTRAMUSCULAR | Status: DC

## 2022-09-22 MED ORDER — INSULIN ASPART 100 UNIT/ML IJ SOLN
0.0000 [IU] | Freq: Three times a day (TID) | INTRAMUSCULAR | Status: DC
  Administered 2022-09-22: 3 [IU] via SUBCUTANEOUS

## 2022-09-22 MED ORDER — DEXTROSE 5 % AND 0.9 % NACL IV BOLUS
1000.0000 mL | Freq: Once | INTRAVENOUS | Status: AC
  Administered 2022-09-22: 1000 mL via INTRAVENOUS

## 2022-09-22 MED ORDER — ENOXAPARIN SODIUM 40 MG/0.4ML IJ SOSY: 40.0000 mg | PREFILLED_SYRINGE | INTRAMUSCULAR | Status: DC

## 2022-09-22 MED ORDER — SODIUM CHLORIDE 0.9 % IV SOLN: INTRAVENOUS | Status: DC

## 2022-09-22 MED ORDER — KCL IN DEXTROSE-NACL 20-5-0.9 MEQ/L-%-% IV SOLN
INTRAVENOUS | Status: DC
  Filled 2022-09-22 (×2): qty 1000

## 2022-09-22 NOTE — ED Notes (Signed)
Per Endotool Insulin Drip titrated up to 8u/hr

## 2022-09-22 NOTE — ED Notes (Signed)
Phone Handoff Report provided to [facility] Transport Team 

## 2022-09-22 NOTE — ED Notes (Addendum)
Spoke with Larene Beach - Diabetes Coordinator on call. Recommendation is to restart insulin drip since her acidosis is not corrected. Spoke with Dr Tyrone Nine, ED Provider, and informed him of recommendation.

## 2022-09-22 NOTE — ED Notes (Signed)
Repeat BMET collected and sent. CBG 373 before Endotool was restarted

## 2022-09-22 NOTE — ED Notes (Signed)
Per ED MD, insulin gtt discontinued. Pt taking POs very well without issue, ED MD will provide orders for sliding scale

## 2022-09-22 NOTE — ED Notes (Incomplete)
Was able to contact [facility]

## 2022-09-22 NOTE — Inpatient Diabetes Management (Addendum)
Inpatient Diabetes Program Recommendations  AACE/ADA: New Consensus Statement on Inpatient Glycemic Control (2015)  Target Ranges:  Prepandial:   less than 140 mg/dL      Peak postprandial:   less than 180 mg/dL (1-2 hours)      Critically ill patients:  140 - 180 mg/dL   Lab Results  Component Value Date   GLUCAP 195 (H) 09/22/2022   HGBA1C 6.4 10/19/2019    Review of Glycemic Control  Latest Reference Range & Units 09/22/22 08:12 09/22/22 09:22 09/22/22 10:58 09/22/22 12:37  Glucose-Capillary 70 - 99 mg/dL 190 (H) 166 (H) 152 (H) 195 (H)    Latest Reference Range & Units 09/22/22 12:31  CO2 22 - 32 mmol/L 14 (L)  Glucose 70 - 99 mg/dL 219 (H)  BUN 6 - 20 mg/dL 14  Creatinine 0.44 - 1.00 mg/dL 0.79  Calcium 8.9 - 10.3 mg/dL 8.1 (L)  Anion gap 5 - 15  7  (L): Data is abnormally low (H): Data is abnormally high Diabetes history: Type 1 DM Outpatient Diabetes medications: Omnipod- 0.6 units/hr =14.4 units/24hrs, ICR: 10, SF:40, target 110 Tresiba 13 units QHS (when pump not on) Current orders for Inpatient glycemic control: Novolog 0-15 units TID, Novolog 4 units TID  Inpatient Diabetes Program Recommendations:    Beta hydroxybutyric acid in process; CO14. Was transitioned without basal, thus anticipate CBGs to be elevated.  If beta hydroxybutyric acid >0.5 would consider restarting IV insulin per Endotool-DKA  Secure chat sent to MD.   Addendum @1504 : Spoke with patient regarding DKA and outpatient management of DM. Patient is followed by Daine Floras, outpatient endocrinology and his notes reveal patient at goal and does well with managing diabetes. Patient explains she removed insulin pump due to needing new supplies and administered Tresiba at the same time. The next morning she woke up with nausea and vomiting. Reviewed patient's current A1c of 6.7%. Explained what a A1c is and what it measures. Also reviewed goal A1c with patient, importance of good glucose control @  home, and blood sugar goals. Reviewed patho of DKA, current labs, anticipated results, need for basal, survival skills, vascular changes and commorbidities. Patient usually wears Dexcom but was in the process of charging the transmitter so was using fingersticks. Patient has upcoming appointment with endocrinology in February.  Discussed current trends and would anticipate blood sugars to remain elevated this afternoon given transition without basal and current beta. Luckily, patient able to tolerate food intake. Discussed recommendations with patient on reapplying insulin pump which she plans to have supplies in the AM as well as using Q4H correction if necessary. Patient has no further questions at this time.  Discussed with Legrand Como, RN. Thanks, Bronson Curb, MSN, RNC-OB Diabetes Coordinator [phone] (8a-5p)

## 2022-09-22 NOTE — ED Notes (Signed)
Engineer, technical sales at bedside

## 2022-09-22 NOTE — ED Notes (Signed)
Informed EDP of current status re: insulin gtt and CBG readings, client taking in POs of liquids well and tolerating well.

## 2022-09-22 NOTE — ED Notes (Signed)
Spoke with Lauren with Diabetes Nurse Coordinator Team for [facility] [place], Spoke to her in regards to current pt status, VS, labs, CBG, sliding scale and able to eat and drink without issues. ED MD informed of communication call

## 2022-09-22 NOTE — ED Notes (Signed)
Resting quietly , cont to await for room assignment, denies any nausea at this time. Alert and oriented. MAE x4

## 2022-09-22 NOTE — H&P (Signed)
PCP:   Chevis Pretty, FNP    Chief Complaint:  Nausea and vomiting  HPI: This is a 32 year old female with history of diabetes mellitus type 1 diagnosed at age 66.  Per patient on Tuesday she woke up with nausea.  She ate half a peanut butter sandwich patient remained nauseous throughout the day.  Around 4:15 PM she started with copious vomiting.  She states she was unable to hold anything down.  She went to the ER by 7:30 PM.  Her nausea subsided fine around 11 PM.  She went to [facility] ER  In the ER patient was found to be in DKA, Endo tool protocol was started.  Patient transferred to [facility] today.  Review of Systems:  The patient denies anorexia, fever, weight loss,, vision loss, decreased hearing, hoarseness, chest pain, syncope, dyspnea on exertion, peripheral edema, balance deficits, hemoptysis, abdominal pain, melena, hematochezia, severe indigestion/heartburn, hematuria, incontinence, genital sores, muscle weakness, suspicious skin lesions, transient blindness, difficulty walking, depression, unusual weight change, abnormal bleeding, enlarged lymph nodes, angioedema, and breast masses.  Past Medical History: Past Medical History:  Diagnosis Date   Bronchitis    Diabetes mellitus without complication ([place])    Hypoglycemia    Past Surgical History:  Procedure Laterality Date   hymen     WISDOM TOOTH EXTRACTION      Medications: Prior to Admission medications   Medication Sig Start Date End Date Taking? Authorizing Provider  BAQSIMI ONE PACK 3 MG/DOSE POWD Place 1 spray into the nose as needed (hypoglycemia). 07/09/22  Yes [provider]  Blood Glucose Monitoring Suppl (FIFTY50 GLUCOSE METER 2.0) w/Device KIT 1 each by Other route as directed. Uses when not using Dexcom 03/03/17  Yes [provider]  cetirizine (ZYRTEC) 10 MG tablet Take 10 mg by mouth as needed for allergies.   Yes [provider]  Continuous Blood Gluc  Receiver ([place]) [place] 1 each by Other route as directed. Use as directed for continuous glucose monitoring. 09/07/18  Yes [provider]  Continuous Blood Gluc Sensor (DEXCOM G6 SENSOR) MISC 1 each by Other route as directed. Use as directed for 10 days for continuous glucose monitoring. 10/25/19  Yes [provider]  Continuous Blood Gluc Transmit (DEXCOM G6 TRANSMITTER) MISC 1 each by Other route as directed. Use as directed for continuous glucose monitoring. Replace every 90 days. 10/25/19  Yes [provider]  diphenhydrAMINE (BENADRYL) 25 mg capsule Take 25 mg by mouth as needed for allergies or itching.   Yes [provider]  glucose blood (CONTOUR NEXT TEST) test strip 1 each by Other route 4 (four) times daily. Uses when not using Dexcom 07/12/19  Yes [provider]  insulin degludec (TRESIBA FLEXTOUCH) 100 UNIT/ML FlexTouch Pen Inject 13 Units into the skin at bedtime. 10/25/19  Yes [provider]  Insulin Disposable Pump (OMNIPOD 5 G6 POD, GEN 5,) MISC Inject 1 each into the skin every 3 (three) days. 08/05/22  Yes [provider]  ketoconazole (NIZORAL) 2 % cream Apply 1 Application topically as needed for irritation.   Yes [provider]  ketoconazole (NIZORAL) 2 % shampoo Apply 1 Application topically once a week. [place] BODY ONCE A WEEK AS MAINTENANCE   Yes [provider]  nystatin-triamcinolone (MYCOLOG II) cream Apply 1 Application topically as needed (flare).   Yes [provider]  cefdinir (OMNICEF) 300 MG capsule Take 1 capsule (300 mg total) by mouth 2 (two) times daily.  1 po BID Patient not taking: Reported on 09/22/2022 12/22/21   Sonny Masters, FNP    Allergies:   Allergies  Allergen Reactions   Metronidazole Nausea And Vomiting and Rash   Septra [Bactrim] Rash   Sulfamethoxazole Rash   Sulfamethoxazole-Trimethoprim Rash    Social History:  reports that she quit smoking  about 5 years ago. Her smoking use included cigarettes. She has a 0.50 pack-year smoking history. She has never used smokeless tobacco. She reports current alcohol use of about 1.0 standard drink of alcohol per week. She reports that she does not use drugs.  Family History: Family History  Problem Relation Age of Onset   Hypertension Father    Diabetes Maternal Grandmother    Liver disease Maternal Grandfather    Hypertension Paternal Grandmother    Hypertension Other    Hyperlipidemia Other    Anesthesia problems Neg Hx     Physical Exam: Vitals:   09/22/22 1600 09/22/22 1707 09/22/22 1830 09/22/22 2025  BP: 105/69 102/61 114/73   Pulse: (!) 103 (!) 112 96   Resp: 17 16 18    Temp:   98.8 F (37.1 C) 98.8 F (37.1 C)  TempSrc:   Oral Oral  SpO2: 100% 100% 100%   Weight:   57.4 kg   Height:   5\' 2"  (1.575 m)     General:  Alert and oriented times three, well developed and nourished, no acute distress Eyes: PERRLA, pink conjunctiva, no scleral icterus ENT: Moist oral mucosa, neck supple, no thyromegaly Lungs: clear to ascultation, no wheeze, no crackles, no use of accessory muscles Cardiovascular: regular rate and rhythm, no regurgitation, no gallops, no murmurs. No carotid bruits, no JVD Abdomen: soft, positive BS, non-tender, non-distended, no organomegaly, not an acute abdomen GU: not examined Neuro: CN II - XII grossly intact, sensation intact Musculoskeletal: strength 5/5 all extremities, no clubbing, cyanosis or edema Skin: no rash, no subcutaneous crepitation, no decubitus Psych: appropriate patient   Labs on Admission:  Recent Labs    09/22/22 1617 09/22/22 1942  NA 131* 135  K 3.7 3.1*  CL 106 113*  CO2 16* 17*  GLUCOSE 371* 140*  BUN 15 16  CREATININE 0.83 0.77  CALCIUM 7.8* 8.4*   Recent Labs    09/21/22 2058  AST 25  ALT 29  ALKPHOS 78  BILITOT 1.0  PROT 8.4*  ALBUMIN 5.0   Recent Labs    09/21/22 2058  LIPASE 25   Recent Labs     09/21/22 2012 09/21/22 2318  WBC 18.7*  --   HGB 15.4* 14.6  HCT 45.9 43.0  MCV 95.4  --   PLT 209  --     Micro Results: Recent Results (from the past 240 hour(s))  Resp panel by RT-PCR (RSV, Flu A&B, Covid) Anterior Nasal Swab     Status: None   Collection Time: 09/21/22 12:15 AM   Specimen: Anterior Nasal Swab  Result Value Ref Range Status   SARS Coronavirus 2 by RT PCR NEGATIVE NEGATIVE Final    Comment: (NOTE) SARS-CoV-2 target nucleic acids are NOT DETECTED.  The SARS-CoV-2 RNA is generally detectable in upper respiratory specimens during the acute phase of infection. The lowest concentration of SARS-CoV-2 viral copies this assay can detect is 138 copies/mL. A negative result does not preclude SARS-Cov-2 infection and should not be used as the sole basis for treatment or other patient management decisions. A negative result may occur with  improper specimen collection/handling, submission of  specimen other than nasopharyngeal swab, presence of viral mutation(s) within the areas targeted by this assay, and inadequate number of viral copies(<138 copies/mL). A negative result must be combined with clinical observations, patient history, and epidemiological information. The expected result is Negative.  Fact Sheet for Patients:  BloggerCourse.com  Fact Sheet for Healthcare Providers:  SeriousBroker.it  This test is no t yet approved or cleared by the [place] FDA and  has been authorized for detection and/or diagnosis of SARS-CoV-2 by FDA under an Emergency Use Authorization (EUA). This EUA will remain  in effect (meaning this test can be used) for the duration of the COVID-19 declaration under Section 564(b)(1) of the Act, 21 U.S.C.section 360bbb-3(b)(1), unless the authorization is terminated  or revoked sooner.       Influenza A by PCR NEGATIVE NEGATIVE Final   Influenza B by PCR NEGATIVE NEGATIVE Final     Comment: (NOTE) The Xpert Xpress SARS-CoV-2/FLU/RSV plus assay is intended as an aid in the diagnosis of influenza from Nasopharyngeal swab specimens and should not be used as a sole basis for treatment. Nasal washings and aspirates are unacceptable for Xpert Xpress SARS-CoV-2/FLU/RSV testing.  Fact Sheet for Patients: BloggerCourse.com  Fact Sheet for Healthcare Providers: SeriousBroker.it  This test is not yet approved or cleared by the [place] FDA and has been authorized for detection and/or diagnosis of SARS-CoV-2 by FDA under an Emergency Use Authorization (EUA). This EUA will remain in effect (meaning this test can be used) for the duration of the COVID-19 declaration under Section 564(b)(1) of the Act, 21 U.S.C. section 360bbb-3(b)(1), unless the authorization is terminated or revoked.     Resp Syncytial Virus by PCR NEGATIVE NEGATIVE Final    Comment: (NOTE) Fact Sheet for Patients: BloggerCourse.com  Fact Sheet for Healthcare Providers: SeriousBroker.it  This test is not yet approved or cleared by the [place] FDA and has been authorized for detection and/or diagnosis of SARS-CoV-2 by FDA under an Emergency Use Authorization (EUA). This EUA will remain in effect (meaning this test can be used) for the duration of the COVID-19 declaration under Section 564(b)(1) of the Act, 21 U.S.C. section 360bbb-3(b)(1), unless the authorization is terminated or revoked.  Performed at [facility], [address] [place], [place] [address]   MRSA Next Gen by PCR, Nasal     Status: None   Collection Time: 09/22/22  6:41 PM   Specimen: Nasal Mucosa; Nasal Swab  Result Value Ref Range Status   MRSA by PCR Next Gen NOT DETECTED NOT DETECTED Final    Comment: (NOTE) The GeneXpert MRSA Assay (FDA approved for NASAL specimens only), is one component of a  comprehensive MRSA colonization surveillance program. It is not intended to diagnose MRSA infection nor to guide or monitor treatment for MRSA infections. Test performance is not FDA approved in patients less than 37 years old. Performed at [facility], [address] [place], [place] [address]      Radiological Exams on Admission: DG Chest Portable 1 View  Result Date: 09/21/2022 CLINICAL DATA:  Suspected better ketoacidosis. EXAM: PORTABLE CHEST 1 VIEW COMPARISON:  None Available. FINDINGS: The heart size and mediastinal contours are within normal limits. Both lungs are hyperinflated but clear. The visualized skeletal structures are unremarkable. IMPRESSION: No acute radiographic chest findings.  Hyperinflated chest. Electronically Signed   By: Almira Bar M.D.   On: 09/21/2022 22:43    Assessment/Plan Present on Admission:  DKA, type 1 (HCC) -Admit  to stepdown -DKA order set initiated by EDP, continued -Serial magnesium, phosphorus every 12 hours -Transition to D5 normal saline with potassium now -Transition to home regimen once anion gap normalized.  Summer Spencer 09/22/2022, 8:47 PM

## 2022-09-23 DIAGNOSIS — E1010 Type 1 diabetes mellitus with ketoacidosis without coma: Secondary | ICD-10-CM | POA: Diagnosis not present

## 2022-09-23 LAB — BASIC METABOLIC PANEL
Anion gap: 6 (ref 5–15)
BUN: 12 mg/dL (ref 6–20)
CO2: 19 mmol/L — ABNORMAL LOW (ref 22–32)
Calcium: 8.3 mg/dL — ABNORMAL LOW (ref 8.9–10.3)
Chloride: 112 mmol/L — ABNORMAL HIGH (ref 98–111)
Creatinine, Ser: 0.79 mg/dL (ref 0.44–1.00)
GFR, Estimated: >60 mL/min (ref >60)
Glucose, Bld: 115 mg/dL — ABNORMAL HIGH (ref 70–99)
Potassium: 3.6 mmol/L (ref 3.5–5.1)
Sodium: 137 mmol/L (ref 135–145)

## 2022-09-23 LAB — GLUCOSE, CAPILLARY
Glucose-Capillary: 126 mg/dL — ABNORMAL HIGH (ref 70–99)
Glucose-Capillary: 131 mg/dL — ABNORMAL HIGH (ref 70–99)
Glucose-Capillary: 138 mg/dL — ABNORMAL HIGH (ref 70–99)
Glucose-Capillary: 148 mg/dL — ABNORMAL HIGH (ref 70–99)
Glucose-Capillary: 161 mg/dL — ABNORMAL HIGH (ref 70–99)
Glucose-Capillary: 176 mg/dL — ABNORMAL HIGH (ref 70–99)
Glucose-Capillary: 183 mg/dL — ABNORMAL HIGH (ref 70–99)
Glucose-Capillary: 192 mg/dL — ABNORMAL HIGH (ref 70–99)
Glucose-Capillary: 307 mg/dL — ABNORMAL HIGH (ref 70–99)
Glucose-Capillary: 93 mg/dL (ref 70–99)

## 2022-09-23 LAB — CBC WITH DIFFERENTIAL/PLATELET
Abs Immature Granulocytes: 0.03 10*3/uL (ref 0.00–0.07)
Basophils Absolute: 0 10*3/uL (ref 0.0–0.1)
Basophils Relative: 0 %
Eosinophils Absolute: 0 10*3/uL (ref 0.0–0.5)
Eosinophils Relative: 0 %
HCT: 32.3 % — ABNORMAL LOW (ref 36.0–46.0)
Hemoglobin: 11.6 g/dL — ABNORMAL LOW (ref 12.0–15.0)
Immature Granulocytes: 0 %
Lymphocytes Relative: 22 %
Lymphs Abs: 2.2 10*3/uL (ref 0.7–4.0)
MCH: 32.5 pg (ref 26.0–34.0)
MCHC: 35.9 g/dL (ref 30.0–36.0)
MCV: 90.5 fL (ref 80.0–100.0)
Monocytes Absolute: 0.7 10*3/uL (ref 0.1–1.0)
Monocytes Relative: 8 %
Neutro Abs: 6.9 10*3/uL (ref 1.7–7.7)
Neutrophils Relative %: 70 %
Platelets: 143 10*3/uL — ABNORMAL LOW (ref 150–400)
RBC: 3.57 MIL/uL — ABNORMAL LOW (ref 3.87–5.11)
RDW: 11.9 % (ref 11.5–15.5)
WBC: 9.9 10*3/uL (ref 4.0–10.5)
nRBC: 0 % (ref 0.0–0.2)

## 2022-09-23 MED ORDER — INSULIN ASPART 100 UNIT/ML IJ SOLN: 0.0000 [IU] | Freq: Every day | INTRAMUSCULAR | Status: DC

## 2022-09-23 MED ORDER — MAGNESIUM SULFATE 2 GM/50ML IV SOLN
2.0000 g | Freq: Once | INTRAVENOUS | Status: AC
  Administered 2022-09-23: 2 g via INTRAVENOUS
  Filled 2022-09-23: qty 50

## 2022-09-23 MED ORDER — POTASSIUM CHLORIDE CRYS ER 20 MEQ PO TBCR
40.0000 meq | EXTENDED_RELEASE_TABLET | Freq: Two times a day (BID) | ORAL | Status: AC
  Administered 2022-09-23 (×2): 40 meq via ORAL
  Filled 2022-09-23 (×2): qty 2

## 2022-09-23 MED ORDER — INSULIN ASPART 100 UNIT/ML IJ SOLN
5.0000 [IU] | Freq: Three times a day (TID) | INTRAMUSCULAR | Status: DC
  Administered 2022-09-23: 5 [IU] via SUBCUTANEOUS

## 2022-09-23 MED ORDER — SODIUM CHLORIDE 0.9% FLUSH
10.0000 mL | Freq: Two times a day (BID) | INTRAVENOUS | Status: DC
  Administered 2022-09-23 (×2): 10 mL

## 2022-09-23 MED ORDER — INSULIN ASPART 100 UNIT/ML IJ SOLN: 0.0000 [IU] | Freq: Three times a day (TID) | INTRAMUSCULAR | Status: DC

## 2022-09-23 MED ORDER — SODIUM CHLORIDE 0.9% FLUSH: 10.0000 mL | INTRAVENOUS | Status: DC | PRN

## 2022-09-23 MED ORDER — INSULIN ASPART 100 UNIT/ML IJ SOLN
0.0000 [IU] | Freq: Three times a day (TID) | INTRAMUSCULAR | Status: DC
  Administered 2022-09-23: 11 [IU] via SUBCUTANEOUS

## 2022-09-23 MED ORDER — INSULIN DETEMIR 100 UNIT/ML ~~LOC~~ SOLN
10.0000 [IU] | Freq: Every day | SUBCUTANEOUS | Status: DC
  Administered 2022-09-23: 10 [IU] via SUBCUTANEOUS
  Filled 2022-09-23 (×2): qty 0.1

## 2022-09-23 MED ORDER — INSULIN ASPART 100 UNIT/ML IJ SOLN: 5.0000 [IU] | Freq: Three times a day (TID) | INTRAMUSCULAR | Status: DC

## 2022-09-23 MED ORDER — SODIUM PHOSPHATES 45 MMOLE/15ML IV SOLN
15.0000 mmol | Freq: Once | INTRAVENOUS | Status: AC
  Administered 2022-09-23: 15 mmol via INTRAVENOUS
  Filled 2022-09-23: qty 5

## 2022-09-23 MED ORDER — INSULIN ASPART 100 UNIT/ML IJ SOLN: 4.0000 [IU] | Freq: Three times a day (TID) | INTRAMUSCULAR | Status: DC

## 2022-09-23 NOTE — Progress Notes (Signed)
A midline has been ordered for this patient. The [facility]AST RN has reviewed the patient's medical record including any arm restrictions, current creatinine clearance, length IV therapy is needed, and infusions needed/ordered to determine if a midline is the appropriate line for this individual patient. If there are contraindications, the physician and primary RN has been contacted by [facility] RN for further discussion. °Midline Education: °a midline is a long peripheral IV placed in the upper arm with the tip located at or near the axilla and distal to the shoulder °should not be used as a CLABSI preventative measure   °it has one lumen only °it can remain in place for up to 29 days  °Safe for Vancomycin infusion LESS THAN 6 days °Is safe for power injection if good blood return can be obtained and line flushes easily °it CANNOT be used for continuous infusion of vesicants. Including TPN and chemotherapy °Should NOT be placed for sole intent of obtaining labs as there is no guarantee blood can be successfully drawn from line  °contraindicated in patients with thrombosis, hypercoagulability, decreased venous flow to the extremities, ESRD (without a nephrologist's approval), small vessels, allergy to polyurethane, or known/suspected presence of a device-related infection, bacteremia, or septicemia  °

## 2022-09-23 NOTE — Inpatient Diabetes Management (Addendum)
Inpatient Diabetes Program Recommendations  AACE/ADA: New Consensus Statement on Inpatient Glycemic Control (2015)  Target Ranges:  Prepandial:   less than 140 mg/dL      Peak postprandial:   less than 180 mg/dL (1-2 hours)      Critically ill patients:  140 - 180 mg/dL   Lab Results  Component Value Date   GLUCAP 307 (H) 09/23/2022   HGBA1C 6.6 (H) 09/22/2022    Review of Glycemic Control  Latest Reference Range & Units 09/23/22 06:04 09/23/22 07:14 09/23/22 08:29 09/23/22 11:12  Glucose-Capillary 70 - 99 mg/dL 161 (H) 176 (H) 192 (H) 307 (H)  (H): Data is abnormally high Diabetes history: Type 1 DM Outpatient Diabetes medications: Omnipod- 0.6 units/hr =14.4 units/24hrs, ICR: 10, SF:40, target 110 Tresiba 13 units QHS (when pump not on) Current orders for Inpatient glycemic control: Novolog 0-15 units TID, Novolog 0-5 units QHS, Levemir 10 units QD   Inpatient Diabetes Program Recommendations:    Consider adding Novolog 4 units TID (assuming patient was consuming >50% of meal).   Of note, patient received Levemir >1 hour following discontinuation of IV insulin. Current CBG 307 mg/dL following breakfast. Anticipate glucose elevations this afternoon.  Secure chat sent to RN & MD.   Addendum @ 1230: Spoke with patient again to answer patient's questions. Patient planning to have husband bring all supplies for insulin pump. Discussed possible timing of reapplication. No further questions at this time.   Thanks, Bronson Curb, MSN, RNC-OB Diabetes Coordinator [phone] (8a-5p)

## 2022-09-23 NOTE — Progress Notes (Signed)
[facility] PROGRESS NOTE    Progress Note  Summer Spencer  RWE:[id number] DOB: 01/10/1991 DOA: 09/21/2022 PCP: Bennie Pierini, FNP     Brief Narrative:   Summer Spencer is an 32 y.o. female past medical history of diabetes mellitus type 1 came in with nausea and vomiting that started 2 days prior to admission, came into the ED was found in DKA and was started on IV insulin   Assessment/Plan:   DKA, type 1 (HCC) She received long-acting insulin yesterday currently on a long-acting insulin, start sliding scale give her a diet, continue CBGs before meals and at bedtime. Repeat a basic metabolic panel in the morning. Monitor electrolytes and replete   DVT prophylaxis: lovenox Family Communication:none Status is: Inpatient Remains inpatient appropriate because: DKA transferred to MedSurg    Code Status:     Code Status Orders  (From admission, onward)           Start     Ordered   09/22/22 2249  Full code  Continuous       Question:  By:  Answer:  Consent: discussion documented in EHR   09/22/22 2252           Code Status History     This patient has a current code status but no historical code status.         IV Access:   Peripheral IV   Procedures and diagnostic studies:   DG Chest Portable 1 View  Result Date: 09/21/2022 CLINICAL DATA:  Suspected better ketoacidosis. EXAM: PORTABLE CHEST 1 VIEW COMPARISON:  None Available. FINDINGS: The heart size and mediastinal contours are within normal limits. Both lungs are hyperinflated but clear. The visualized skeletal structures are unremarkable. IMPRESSION: No acute radiographic chest findings.  Hyperinflated chest. Electronically Signed   By: Almira Bar M.D.   On: 09/21/2022 22:43     Medical Consultants:   None.   Subjective:    Summer Spencer no complaints  Objective:    Vitals:   09/23/22 0100 09/23/22 0200 09/23/22 0300 09/23/22 0410  BP: (!) 102/58  122/76    Pulse: 69 87 78   Resp: 11 13 13    Temp:    98.6 F (37 C)  TempSrc:    Oral  SpO2: 100% 100% 99%   Weight:      Height:       SpO2: 99 %   Intake/Output Summary (Last 24 hours) at 09/23/2022 0736 Last data filed at 09/23/2022 0419 Gross per 24 hour  Intake 1017.31 ml  Output --  Net 1017.31 ml   Filed Weights   09/21/22 2005 09/22/22 1830  Weight: 63.5 kg 57.4 kg    Exam: General exam: In no acute distress. Respiratory system: Good air movement and clear to auscultation. Cardiovascular system: S1 & S2 heard, RRR. No JVD. Gastrointestinal system: Abdomen is nondistended, soft and nontender.  Extremities: No pedal edema. Skin: No rashes, lesions or ulcers Psychiatry: Judgement and insight appear normal. Mood & affect appropriate.    Data Reviewed:    Labs: Basic Metabolic Panel: Recent Labs  Lab 09/22/22 1231 09/22/22 1617 09/22/22 1942 09/22/22 2256 09/23/22 0310  NA 131* 131* 135 139 137  K 4.1 3.7 3.1* 3.5 3.6  CL 110 106 113* 113* 112*  CO2 14* 16* 17* 18* 19*  GLUCOSE 219* 371* 140* 131* 115*  BUN 14 15 16 14 12   CREATININE 0.79 0.83 0.77 0.75 0.79  CALCIUM 8.1* 7.8* 8.4* 8.1* 8.3*  MG  --   --   --  1.7  --   PHOS  --   --   --  1.7*  --    GFR Estimated Creatinine Clearance: 79.1 mL/min (by C-G formula based on SCr of 0.79 mg/dL). Liver Function Tests: Recent Labs  Lab 09/21/22 2058  AST 25  ALT 29  ALKPHOS 78  BILITOT 1.0  PROT 8.4*  ALBUMIN 5.0   Recent Labs  Lab 09/21/22 2058  LIPASE 25   No results for input(s): "AMMONIA" in the last 168 hours. Coagulation profile No results for input(s): "INR", "PROTIME" in the last 168 hours. COVID-19 Labs  No results for input(s): "DDIMER", "FERRITIN", "LDH", "CRP" in the last 72 hours.  Lab Results  Component Value Date   SARSCOV2NAA NEGATIVE 09/21/2022   SARSCOV2NAA Detected (A) 10/01/2020    CBC: Recent Labs  Lab 09/21/22 2012 09/21/22 2318 09/22/22 2256 09/23/22 0310  WBC  18.7*  --  11.9* 9.9  NEUTROABS  --   --   --  6.9  HGB 15.4* 14.6 11.5* 11.6*  HCT 45.9 43.0 32.4* 32.3*  MCV 95.4  --  91.5 90.5  PLT 209  --  147* 143*   Cardiac Enzymes: No results for input(s): "CKTOTAL", "CKMB", "CKMBINDEX", "TROPONINI" in the last 168 hours. BNP (last 3 results) No results for input(s): "PROBNP" in the last 8760 hours. CBG: Recent Labs  Lab 09/23/22 0219 09/23/22 0359 09/23/22 0503 09/23/22 0604 09/23/22 0714  GLUCAP 148* 126* 131* 161* 176*   D-Dimer: No results for input(s): "DDIMER" in the last 72 hours. Hgb A1c: Recent Labs    09/22/22 1231  HGBA1C 6.6*   Lipid Profile: No results for input(s): "CHOL", "HDL", "LDLCALC", "TRIG", "CHOLHDL", "LDLDIRECT" in the last 72 hours. Thyroid function studies: No results for input(s): "TSH", "T4TOTAL", "T3FREE", "THYROIDAB" in the last 72 hours.  Invalid input(s): "FREET3" Anemia work up: No results for input(s): "VITAMINB12", "FOLATE", "FERRITIN", "TIBC", "IRON", "RETICCTPCT" in the last 72 hours. Sepsis Labs: Recent Labs  Lab 09/21/22 2012 09/22/22 2256 09/23/22 0310  WBC 18.7* 11.9* 9.9   Microbiology Recent Results (from the past 240 hour(s))  Resp panel by RT-PCR (RSV, Flu A&B, Covid) Anterior Nasal Swab     Status: None   Collection Time: 09/21/22 12:15 AM   Specimen: Anterior Nasal Swab  Result Value Ref Range Status   SARS Coronavirus 2 by RT PCR NEGATIVE NEGATIVE Final    Comment: (NOTE) SARS-CoV-2 target nucleic acids are NOT DETECTED.  The SARS-CoV-2 RNA is generally detectable in upper respiratory specimens during the acute phase of infection. The lowest concentration of SARS-CoV-2 viral copies this assay can detect is 138 copies/mL. A negative result does not preclude SARS-Cov-2 infection and should not be used as the sole basis for treatment or other patient management decisions. A negative result may occur with  improper specimen collection/handling, submission of specimen  other than nasopharyngeal swab, presence of viral mutation(s) within the areas targeted by this assay, and inadequate number of viral copies(<138 copies/mL). A negative result must be combined with clinical observations, patient history, and epidemiological information. The expected result is Negative.  Fact Sheet for Patients:  EntrepreneurPulse.com.au  Fact Sheet for Healthcare Providers:  IncredibleEmployment.be  This test is no t yet approved or cleared by the [place] FDA and  has been authorized for detection and/or diagnosis of SARS-CoV-2 by FDA under an Emergency Use Authorization (EUA). This EUA will remain  in effect (meaning this test can  be used) for the duration of the COVID-19 declaration under Section 564(b)(1) of the Act, 21 U.S.C.section 360bbb-3(b)(1), unless the authorization is terminated  or revoked sooner.       Influenza A by PCR NEGATIVE NEGATIVE Final   Influenza B by PCR NEGATIVE NEGATIVE Final    Comment: (NOTE) The Xpert Xpress SARS-CoV-2/FLU/RSV plus assay is intended as an aid in the diagnosis of influenza from Nasopharyngeal swab specimens and should not be used as a sole basis for treatment. Nasal washings and aspirates are unacceptable for Xpert Xpress SARS-CoV-2/FLU/RSV testing.  Fact Sheet for Patients: EntrepreneurPulse.com.au  Fact Sheet for Healthcare Providers: IncredibleEmployment.be  This test is not yet approved or cleared by the [place] FDA and has been authorized for detection and/or diagnosis of SARS-CoV-2 by FDA under an Emergency Use Authorization (EUA). This EUA will remain in effect (meaning this test can be used) for the duration of the COVID-19 declaration under Section 564(b)(1) of the Act, 21 U.S.C. section 360bbb-3(b)(1), unless the authorization is terminated or revoked.     Resp Syncytial Virus by PCR NEGATIVE NEGATIVE Final     Comment: (NOTE) Fact Sheet for Patients: EntrepreneurPulse.com.au  Fact Sheet for Healthcare Providers: IncredibleEmployment.be  This test is not yet approved or cleared by the [place] FDA and has been authorized for detection and/or diagnosis of SARS-CoV-2 by FDA under an Emergency Use Authorization (EUA). This EUA will remain in effect (meaning this test can be used) for the duration of the COVID-19 declaration under Section 564(b)(1) of the Act, 21 U.S.C. section 360bbb-3(b)(1), unless the authorization is terminated or revoked.  Performed at [facility], [address] [place], [place] [address]   MRSA Next Gen by PCR, Nasal     Status: None   Collection Time: 09/22/22  6:41 PM   Specimen: Nasal Mucosa; Nasal Swab  Result Value Ref Range Status   MRSA by PCR Next Gen NOT DETECTED NOT DETECTED Final    Comment: (NOTE) The GeneXpert MRSA Assay (FDA approved for NASAL specimens only), is one component of a comprehensive MRSA colonization surveillance program. It is not intended to diagnose MRSA infection nor to guide or monitor treatment for MRSA infections. Test performance is not FDA approved in patients less than 36 years old. Performed at [facility], [place] [address] [place], [place] [address]      Medications:    Chlorhexidine Gluconate Cloth  6 each Topical Daily   enoxaparin (LOVENOX) injection  40 mg Subcutaneous Q24H   insulin aspart  0-15 Units Subcutaneous TID WC   insulin aspart  4 Units Subcutaneous TID WC   sodium chloride flush  10-40 mL Intracatheter Q12H   Continuous Infusions:  dextrose 5 % and 0.9 % NaCl with KCl 20 mEq/L 150 mL/hr at 09/23/22 0419   insulin 2.4 Units/hr (09/23/22 0717)   sodium phosphate 15 mmol in dextrose 5 % 250 mL infusion 15 mmol (09/23/22 0416)      LOS: 1 day   Charlynne Cousins  [facility]  09/23/2022, 7:36 AM

## 2022-09-23 NOTE — Progress Notes (Signed)
  Transition of Care (TOC) Screening Note   Patient Details  Name: Summer Spencer Date of Birth: [birth year]   Transition of Care [facility] CM/SW Contact:    Lennart Pall, LCSW Phone Number: 09/23/2022, 3:31 PM    Transition of Care Department [facility]) has reviewed patient and no TOC needs have been identified at this time. We will continue to monitor patient advancement through interdisciplinary progression rounds. If new patient transition needs arise, please place a TOC consult.

## 2022-09-24 DIAGNOSIS — E1010 Type 1 diabetes mellitus with ketoacidosis without coma: Secondary | ICD-10-CM | POA: Diagnosis not present

## 2022-09-24 LAB — GLUCOSE, CAPILLARY
Glucose-Capillary: 245 mg/dL — ABNORMAL HIGH (ref 70–99)
Glucose-Capillary: 248 mg/dL — ABNORMAL HIGH (ref 70–99)
Glucose-Capillary: 183 mg/dL — ABNORMAL HIGH (ref 70–99)
Glucose-Capillary: 236 mg/dL — ABNORMAL HIGH (ref 70–99)
Glucose-Capillary: 174 mg/dL — ABNORMAL HIGH (ref 70–99)

## 2022-09-24 MED ORDER — INSULIN ASPART 100 UNIT/ML IJ SOLN
2.0000 [IU] | INTRAMUSCULAR | Status: AC
  Administered 2022-09-24: 2 [IU] via SUBCUTANEOUS

## 2022-09-24 MED ORDER — INSULIN PUMP
Freq: Three times a day (TID) | SUBCUTANEOUS | Status: DC
  Filled 2022-09-24: qty 1

## 2022-09-24 MED ORDER — INSULIN ASPART 100 UNIT/ML IJ SOLN
2.0000 [IU] | Freq: Once | INTRAMUSCULAR | Status: AC
  Administered 2022-09-24: 2 [IU] via SUBCUTANEOUS

## 2022-09-24 NOTE — Progress Notes (Signed)
Patient refused Lovenox and SCD's last night.

## 2022-09-24 NOTE — Discharge Summary (Addendum)
Physician Discharge Summary  Summer Spencer ZOX:[id number] DOB: 1991/03/14 DOA: 09/21/2022  PCP: Bennie Pierini, FNP  Admit date: 09/21/2022 Discharge date: 09/24/2022  Admitted From: Home Disposition:  Home  Recommendations for Outpatient Follow-up:  Follow up with PCP in 1-2 weeks Please obtain BMP/CBC in one week   Home Health:No Equipment/Devices:None  Discharge Condition:Stable CODE STATUS:Full Diet recommendation: Heart Healthy  Brief/Interim Summary: 32 y.o. female past medical history of diabetes mellitus type 1 came in with nausea and vomiting that started 2 days prior to admission, came into the ED was found in DKA and was started on IV insulin   Discharge Diagnoses:  Principal Problem:   DKA, type 1 (HCC)  DKA/diabetes mellitus type 2:  In the setting of viral infection and ran out of her insulin on admission she was started on IV insulin IV fluids her blood glucose corrected she was placed back on her insulin pump she was discharged in stable condition.  Acute kidney injury: Likely prerenal azotemia resolved with IV fluid resuscitation.  Hyperkalemia/hypokalemia: Resolved with correction of her metabolic acidosis. She became hypokalemic and was repleted orally now resolved.     Discharge Instructions  Discharge Instructions     Diet - low sodium heart healthy   Complete by: As directed    Increase activity slowly   Complete by: As directed       Allergies as of 09/24/2022       Reactions   Metronidazole Nausea And Vomiting, Rash   Septra [bactrim] Rash   Sulfamethoxazole Rash   Sulfamethoxazole-trimethoprim Rash        Medication List     TAKE these medications    Baqsimi One Pack 3 MG/DOSE Powd Generic drug: Glucagon Place 1 spray into the nose as needed (hypoglycemia).   Contour Next Test test strip Generic drug: glucose blood 1 each by Other route 4 (four) times daily. Uses when not using Dexcom   Dexcom G6 Receiver  Devi 1 each by Other route as directed. Use as directed for continuous glucose monitoring.   Dexcom G6 Sensor Misc 1 each by Other route as directed. Use as directed for 10 days for continuous glucose monitoring.   Dexcom G6 Transmitter Misc 1 each by Other route as directed. Use as directed for continuous glucose monitoring. Replace every 90 days.   diphenhydrAMINE 25 mg capsule Commonly known as: BENADRYL Take 25 mg by mouth as needed for allergies or itching.   Fifty50 Glucose Meter 2.0 w/Device Kit 1 each by Other route as directed. Uses when not using Dexcom   ketoconazole 2 % shampoo Commonly known as: NIZORAL Apply 1 Application topically once a week. WASH BODY ONCE A WEEK AS MAINTENANCE What changed: Another medication with the same name was removed. Continue taking this medication, and follow the directions you see here.   nystatin-triamcinolone cream Commonly known as: MYCOLOG II Apply 1 Application topically as needed (flare).   Omnipod 5 G6 Pod (Gen 5) Misc Inject 1 each into the skin every 3 (three) days.   Evaristo Bury FlexTouch 100 UNIT/ML FlexTouch Pen Generic drug: insulin degludec Inject 13 Units into the skin at bedtime.        Allergies  Allergen Reactions   Metronidazole Nausea And Vomiting and Rash   Septra [Bactrim] Rash   Sulfamethoxazole Rash   Sulfamethoxazole-Trimethoprim Rash    Consultations: None   Procedures/Studies: DG Chest Portable 1 View  Result Date: 09/21/2022 CLINICAL DATA:  Suspected better ketoacidosis. EXAM: PORTABLE CHEST 1 VIEW COMPARISON:  None Available. FINDINGS: The heart size and mediastinal contours are within normal limits. Both lungs are hyperinflated but clear. The visualized skeletal structures are unremarkable. IMPRESSION: No acute radiographic chest findings.  Hyperinflated chest. Electronically Signed   By: Almira Bar M.D.   On: 09/21/2022 22:43   (Echo, Carotid, EGD, Colonoscopy, ERCP)    Subjective: No  complaints  Discharge Exam: Vitals:   09/23/22 2217 09/24/22 0450  BP: 113/85 118/79  Pulse: 79 82  Resp: 18 17  Temp: 98.9 F (37.2 C) 98.4 F (36.9 C)  SpO2: 99% 99%   Vitals:   09/23/22 1105 09/23/22 1315 09/23/22 2217 09/24/22 0450  BP:  119/77 113/85 118/79  Pulse:  92 79 82  Resp:  15 18 17   Temp: 98 F (36.7 C) 98.2 F (36.8 C) 98.9 F (37.2 C) 98.4 F (36.9 C)  TempSrc: Oral Oral Oral Oral  SpO2:  99% 99% 99%  Weight:      Height:        General: Pt is alert, awake, not in acute distress Cardiovascular: RRR, S1/S2 +, no rubs, no gallops Respiratory: CTA bilaterally, no wheezing, no rhonchi Abdominal: Soft, NT, ND, bowel sounds + Extremities: no edema, no cyanosis    The results of significant diagnostics from this hospitalization (including imaging, microbiology, ancillary and laboratory) are listed below for reference.     Microbiology: Recent Results (from the past 240 hour(s))  Resp panel by RT-PCR (RSV, Flu A&B, Covid) Anterior Nasal Swab     Status: None   Collection Time: 09/21/22 12:15 AM   Specimen: Anterior Nasal Swab  Result Value Ref Range Status   SARS Coronavirus 2 by RT PCR NEGATIVE NEGATIVE Final    Comment: (NOTE) SARS-CoV-2 target nucleic acids are NOT DETECTED.  The SARS-CoV-2 RNA is generally detectable in upper respiratory specimens during the acute phase of infection. The lowest concentration of SARS-CoV-2 viral copies this assay can detect is 138 copies/mL. A negative result does not preclude SARS-Cov-2 infection and should not be used as the sole basis for treatment or other patient management decisions. A negative result may occur with  improper specimen collection/handling, submission of specimen other than nasopharyngeal swab, presence of viral mutation(s) within the areas targeted by this assay, and inadequate number of viral copies(<138 copies/mL). A negative result must be combined with clinical observations, patient  history, and epidemiological information. The expected result is Negative.  Fact Sheet for Patients:  09/23/22  Fact Sheet for Healthcare Providers:  BloggerCourse.com  This test is no t yet approved or cleared by the [employer]ious[employer].[place] FDA and  has been authorized for detection and/or diagnosis of SARS-CoV-2 by FDA under an Emergency Use Authorization (EUA). This EUA will remain  in effect (meaning this test can be used) for the duration of the COVID-19 declaration under Section 564(b)(1) of the Act, 21 U.S.C.section 360bbb-3(b)(1), unless the authorization is terminated  or revoked sooner.       Influenza A by PCR NEGATIVE NEGATIVE Final   Influenza B by PCR NEGATIVE NEGATIVE Final    Comment: (NOTE) The Xpert Xpress SARS-CoV-2/FLU/RSV plus assay is intended as an aid in the diagnosis of influenza from Nasopharyngeal swab specimens and should not be used as a sole basis for treatment. Nasal washings and aspirates are unacceptable for Xpert Xpress SARS-CoV-2/FLU/RSV testing.  Fact Sheet for Patients: [place]  Fact Sheet for Healthcare Providers: BloggerCourse.com  This test is not yet approved or cleared by the [employer]ious[employer]ker.[place] FDA and has been  authorized for detection and/or diagnosis of SARS-CoV-2 by FDA under an Emergency Use Authorization (EUA). This EUA will remain in effect (meaning this test can be used) for the duration of the COVID-19 declaration under Section 564(b)(1) of the Act, 21 U.S.C. section 360bbb-3(b)(1), unless the authorization is terminated or revoked.     Resp Syncytial Virus by PCR NEGATIVE NEGATIVE Final    Comment: (NOTE) Fact Sheet for Patients: EntrepreneurPulse.com.au  Fact Sheet for Healthcare Providers: IncredibleEmployment.be  This test is not yet approved or cleared by the [place] FDA  and has been authorized for detection and/or diagnosis of SARS-CoV-2 by FDA under an Emergency Use Authorization (EUA). This EUA will remain in effect (meaning this test can be used) for the duration of the COVID-19 declaration under Section 564(b)(1) of the Act, 21 U.S.C. section 360bbb-3(b)(1), unless the authorization is terminated or revoked.  Performed at [facility], [address] [place], [place] [address]   MRSA Next Gen by PCR, Nasal     Status: None   Collection Time: 09/22/22  6:41 PM   Specimen: Nasal Mucosa; Nasal Swab  Result Value Ref Range Status   MRSA by PCR Next Gen NOT DETECTED NOT DETECTED Final    Comment: (NOTE) The GeneXpert MRSA Assay (FDA approved for NASAL specimens only), is one component of a comprehensive MRSA colonization surveillance program. It is not intended to diagnose MRSA infection nor to guide or monitor treatment for MRSA infections. Test performance is not FDA approved in patients less than 91 years old. Performed at [facility], [place] [address], [place], [place] [address]      Labs: BNP (last 3 results) No results for input(s): "BNP" in the last 8760 hours. Basic Metabolic Panel: Recent Labs  Lab 09/22/22 1231 09/22/22 1617 09/22/22 1942 09/22/22 2256 09/23/22 0310  NA 131* 131* 135 139 137  K 4.1 3.7 3.1* 3.5 3.6  CL 110 106 113* 113* 112*  CO2 14* 16* 17* 18* 19*  GLUCOSE 219* 371* 140* 131* 115*  BUN 14 15 16 14 12   CREATININE 0.79 0.83 0.77 0.75 0.79  CALCIUM 8.1* 7.8* 8.4* 8.1* 8.3*  MG  --   --   --  1.7  --   PHOS  --   --   --  1.7*  --    Liver Function Tests: Recent Labs  Lab 09/21/22 2058  AST 25  ALT 29  ALKPHOS 78  BILITOT 1.0  PROT 8.4*  ALBUMIN 5.0   Recent Labs  Lab 09/21/22 2058  LIPASE 25   No results for input(s): "AMMONIA" in the last 168 hours. CBC: Recent Labs  Lab 09/21/22 2012 09/21/22 2318 09/22/22 2256 09/23/22 0310  WBC 18.7*  --  11.9* 9.9   NEUTROABS  --   --   --  6.9  HGB 15.4* 14.6 11.5* 11.6*  HCT 45.9 43.0 32.4* 32.3*  MCV 95.4  --  91.5 90.5  PLT 209  --  147* 143*   Cardiac Enzymes: No results for input(s): "CKTOTAL", "CKMB", "CKMBINDEX", "TROPONINI" in the last 168 hours. BNP: Invalid input(s): "POCBNP" CBG: Recent Labs  Lab 09/24/22 0251 09/24/22 0448 09/24/22 0742 09/24/22 1041 09/24/22 1248  GLUCAP 245* 248* 183* 236* 174*   D-Dimer No results for input(s): "DDIMER" in the last 72 hours. Hgb A1c Recent Labs    09/22/22 1231  HGBA1C 6.6*   Lipid Profile No results for input(s): "CHOL", "HDL", "LDLCALC", "TRIG", "CHOLHDL", "LDLDIRECT" in the  last 72 hours. Thyroid function studies No results for input(s): "TSH", "T4TOTAL", "T3FREE", "THYROIDAB" in the last 72 hours.  Invalid input(s): "FREET3" Anemia work up No results for input(s): "VITAMINB12", "FOLATE", "FERRITIN", "TIBC", "IRON", "RETICCTPCT" in the last 72 hours. Urinalysis    Component Value Date/Time   COLORURINE YELLOW 09/21/2022 2012   APPEARANCEUR CLEAR 09/21/2022 2012   APPEARANCEUR Clear 12/22/2017 1047   LABSPEC >=1.030 09/21/2022 2012   PHURINE 5.0 09/21/2022 2012   GLUCOSEU >=500 (A) 09/21/2022 2012   HGBUR SMALL (A) 09/21/2022 2012   BILIRUBINUR NEGATIVE 09/21/2022 2012   BILIRUBINUR Negative 12/22/2017 1047   KETONESUR >=80 (A) 09/21/2022 2012   PROTEINUR 30 (A) 09/21/2022 2012   UROBILINOGEN 0.2 08/29/2011 1820   NITRITE NEGATIVE 09/21/2022 2012   LEUKOCYTESUR NEGATIVE 09/21/2022 2012   Sepsis Labs Recent Labs  Lab 09/21/22 2012 09/22/22 2256 09/23/22 0310  WBC 18.7* 11.9* 9.9   Microbiology Recent Results (from the past 240 hour(s))  Resp panel by RT-PCR (RSV, Flu A&B, Covid) Anterior Nasal Swab     Status: None   Collection Time: 09/21/22 12:15 AM   Specimen: Anterior Nasal Swab  Result Value Ref Range Status   SARS Coronavirus 2 by RT PCR NEGATIVE NEGATIVE Final    Comment: (NOTE) SARS-CoV-2 target  nucleic acids are NOT DETECTED.  The SARS-CoV-2 RNA is generally detectable in upper respiratory specimens during the acute phase of infection. The lowest concentration of SARS-CoV-2 viral copies this assay can detect is 138 copies/mL. A negative result does not preclude SARS-Cov-2 infection and should not be used as the sole basis for treatment or other patient management decisions. A negative result may occur with  improper specimen collection/handling, submission of specimen other than nasopharyngeal swab, presence of viral mutation(s) within the areas targeted by this assay, and inadequate number of viral copies(<138 copies/mL). A negative result must be combined with clinical observations, patient history, and epidemiological information. The expected result is Negative.  Fact Sheet for Patients:  EntrepreneurPulse.com.au  Fact Sheet for Healthcare Providers:  IncredibleEmployment.be  This test is no t yet approved or cleared by the [place] FDA and  has been authorized for detection and/or diagnosis of SARS-CoV-2 by FDA under an Emergency Use Authorization (EUA). This EUA will remain  in effect (meaning this test can be used) for the duration of the COVID-19 declaration under Section 564(b)(1) of the Act, 21 U.S.C.section 360bbb-3(b)(1), unless the authorization is terminated  or revoked sooner.       Influenza A by PCR NEGATIVE NEGATIVE Final   Influenza B by PCR NEGATIVE NEGATIVE Final    Comment: (NOTE) The Xpert Xpress SARS-CoV-2/FLU/RSV plus assay is intended as an aid in the diagnosis of influenza from Nasopharyngeal swab specimens and should not be used as a sole basis for treatment. Nasal washings and aspirates are unacceptable for Xpert Xpress SARS-CoV-2/FLU/RSV testing.  Fact Sheet for Patients: EntrepreneurPulse.com.au  Fact Sheet for Healthcare  Providers: IncredibleEmployment.be  This test is not yet approved or cleared by the [place] FDA and has been authorized for detection and/or diagnosis of SARS-CoV-2 by FDA under an Emergency Use Authorization (EUA). This EUA will remain in effect (meaning this test can be used) for the duration of the COVID-19 declaration under Section 564(b)(1) of the Act, 21 U.S.C. section 360bbb-3(b)(1), unless the authorization is terminated or revoked.     Resp Syncytial Virus by PCR NEGATIVE NEGATIVE Final    Comment: (NOTE) Fact Sheet for Patients: EntrepreneurPulse.com.au  Fact Sheet for Healthcare  Providers: SeriousBroker.it  This test is not yet approved or cleared by the [place] and has been authorized for detection and/or diagnosis of SARS-CoV-2 by FDA under an Emergency Use Authorization (EUA). This EUA will remain in effect (meaning this test can be used) for the duration of the COVID-19 declaration under Section 564(b)(1) of the Act, 21 U.S.C. section 360bbb-3(b)(1), unless the authorization is terminated or revoked.  Performed at [facility], [address] [place], [place] [address]   MRSA Next Gen by PCR, Nasal     Status: None   Collection Time: 09/22/22  6:41 PM   Specimen: Nasal Mucosa; Nasal Swab  Result Value Ref Range Status   MRSA by PCR Next Gen NOT DETECTED NOT DETECTED Final    Comment: (NOTE) The GeneXpert MRSA Assay (FDA approved for NASAL specimens only), is one component of a comprehensive MRSA colonization surveillance program. It is not intended to diagnose MRSA infection nor to guide or monitor treatment for MRSA infections. Test performance is not FDA approved in patients less than 21 years old. Performed at [facility], [address] [place], [place] [address]    SIGNED:   Marinda Elk, MD  [facility] 09/24/2022, 1:02  PM Pager   If 7PM-7AM, please contact night-coverage [email or web address] Password [email or web address]

## 2022-09-24 NOTE — Progress Notes (Signed)
Patient's CBG was taken with hospital device and her dexcom. Values reported to Bronson Curb DM Coordinator and are within acceptable range for agreement of devices. Ivan Anchors, RN 09/24/22 11:59 AM

## 2022-09-24 NOTE — Plan of Care (Signed)
Patient discharging home via private vehicle with husband, following education from DM Coordinator and calibration checks of Dexcom with hospital glucometer. Ivan Anchors, RN 09/24/22 2:19 PM

## 2022-09-24 NOTE — Inpatient Diabetes Management (Addendum)
Inpatient Diabetes Program Recommendations  AACE/ADA: New Consensus Statement on Inpatient Glycemic Control (2015)  Target Ranges:  Prepandial:   less than 140 mg/dL      Peak postprandial:   less than 180 mg/dL (1-2 hours)      Critically ill patients:  140 - 180 mg/dL   Lab Results  Component Value Date   GLUCAP 183 (H) 09/24/2022   HGBA1C 6.6 (H) 09/22/2022    Review of Glycemic Control  Latest Reference Range & Units 09/23/22 22:15 09/24/22 02:51 09/24/22 04:48 09/24/22 07:42  Glucose-Capillary 70 - 99 mg/dL 138 (H) 245 (H) 248 (H) 183 (H)  (H): Data is abnormally high Diabetes history: Type 1 DM Outpatient Diabetes medications: Omnipod- 0.6 units/hr =14.4 units/24hrs, ICR: 10, SF:40, target 110 Tresiba 13 units QHS (when pump not on) Current orders for Inpatient glycemic control: Novolog 0-15 units TID, Novolog 0-5 units QHS, Levemir 10 units QD, novolog 5 units TID   Inpatient Diabetes Program Recommendations:     Consider adding insulin pump order this AM. Patient has all supplies and is preparing for discharge today.  Secure chat sent to MD.   At bedside speaking with patient. Patient applying insulin pump and Dexcom. RN at bedside, discussed change in orders per MD and verifying CBG with fingerstick and Dexcom within the hour. Patient to independently manage. No further questions at this time.   Thanks, Bronson Curb, MSN, RNC-OB Diabetes Coordinator [phone] (8a-5p)

## 2022-09-24 NOTE — Progress Notes (Signed)
Patient requested a blood glucose check and at 2:51 it was 245. She is requesting insulin coverage. Messaged Raenette Rover.

## 2022-09-24 NOTE — Progress Notes (Signed)
Blood sugar at 4:48 is 248. Messaged Raenette Rover.

## 2022-09-24 NOTE — Progress Notes (Signed)
The patient stated she wants to put her home insulin pump on for blood sugar management. I said that the hospital has plans to manage her blood sugar. The patient responded that her blood sugar will only go up and she does not want to regress from her progress level. Messaged Raenette Rover.

## 2022-09-27 ENCOUNTER — Telehealth: Payer: Self-pay | Admitting: Nurse Practitioner

## 2022-09-27 ENCOUNTER — Encounter: Payer: Self-pay | Admitting: *Deleted

## 2022-09-27 ENCOUNTER — Other Ambulatory Visit: Payer: BC Managed Care – PPO

## 2022-09-27 ENCOUNTER — Telehealth: Payer: Self-pay | Admitting: *Deleted

## 2022-09-27 ENCOUNTER — Other Ambulatory Visit: Payer: Self-pay | Admitting: Nurse Practitioner

## 2022-09-27 DIAGNOSIS — N760 Acute vaginitis: Secondary | ICD-10-CM | POA: Diagnosis not present

## 2022-09-27 DIAGNOSIS — Z09 Encounter for follow-up examination after completed treatment for conditions other than malignant neoplasm: Secondary | ICD-10-CM

## 2022-09-27 NOTE — Telephone Encounter (Signed)
Patient aware that are orders placed.

## 2022-09-27 NOTE — Patient Outreach (Signed)
  Care Coordination TOC Note Transition Care Management Follow-up Telephone Call Date of discharge and from where: [facility] on 09/24/22 How have you been since you were released from the hospital? "Really exhausted and tired. I've been taking naps throughout the day. My blood sugars have been good. They're back to normal." Any questions or concerns? Yes. Left message for PCP regarding need to repeat labs. Chart reviewed and advised repeat was recommended in one week and that potassium and magnesium were normal at discharge. Potassium was on the lower end though. Calcium is low and will be rechecked as well. Pt should get an official response from PCP office on their recommendation.   Items Reviewed: Did the pt receive and understand the discharge instructions provided? Yes  Medications obtained and verified? Yes . No new meds. Resumed home meds. Other? Yes . Discussed blood sugar management and use of insulin pump and Dexcom CGM. Discussed reason for DKA was due to stomach virus with vomiting. That has resolved. Discussed that DKA also causes N/V. Patient is aware of symptoms to monitor for. Any new allergies since your discharge? No  Dietary orders reviewed? No Do you have support at home? Yes   Home Care and Equipment/Supplies: Were home health services ordered? no If so, what is the name of the agency?   Has the agency set up a time to come to the patient's home? not applicable Were any new equipment or medical supplies ordered?  No What is the name of the medical supply agency?  Were you able to get the supplies/equipment? not applicable Do you have any questions related to the use of the equipment or supplies? No  Functional Questionnaire: (I = Independent and D = Dependent) ADLs: I  Bathing/Dressing- I  Meal Prep- I  Eating- I  Maintaining continence- I  Transferring/Ambulation- I  Managing Meds- I  Follow up appointments reviewed:  PCP Hospital f/u appt confirmed? Yes   Scheduled to see  Chevis Pretty, FNP on 10/04/22 at 11:00 [facility] f/u appt confirmed?  N/A   Are transportation arrangements needed? No  If their condition worsens, is the pt aware to call PCP or go to the Emergency Dept.? Yes Was the patient provided with contact information for the PCP's office or ED? Yes Was to pt encouraged to call back with questions or concerns? Yes  SDOH assessments and interventions completed:   Yes SDOH Interventions Today    Flowsheet Row Most Recent Value  SDOH Interventions   Housing Interventions Intervention Not Indicated  Transportation Interventions Intervention Not Indicated  Financial Strain Interventions Intervention Not Indicated       Care Coordination Interventions:  Outlined above    Encounter Outcome:  Pt. Visit Completed    Chong Sicilian, BSN, RN-BC RN Care Coordinator [place]: [phone] Main #: [phone]

## 2022-09-27 NOTE — Telephone Encounter (Signed)
That is fine- I will place orders.

## 2022-09-28 LAB — BMP8+EGFR
BUN/Creatinine Ratio: 24 — ABNORMAL HIGH (ref 9–23)
BUN: 20 mg/dL (ref 6–20)
CO2: 19 mmol/L — ABNORMAL LOW (ref 20–29)
Calcium: 9.5 mg/dL (ref 8.7–10.2)
Chloride: 100 mmol/L (ref 96–106)
Creatinine, Ser: 0.83 mg/dL (ref 0.57–1.00)
Glucose: 173 mg/dL — ABNORMAL HIGH (ref 70–99)
Potassium: 4.4 mmol/L (ref 3.5–5.2)
Sodium: 136 mmol/L (ref 134–144)
eGFR: 95 mL/min/{1.73_m2} (ref >59)

## 2022-09-28 LAB — MAGNESIUM: Magnesium: 2 mg/dL (ref 1.6–2.3)

## 2022-10-04 ENCOUNTER — Ambulatory Visit (INDEPENDENT_AMBULATORY_CARE_PROVIDER_SITE_OTHER): Payer: BC Managed Care – PPO | Admitting: Nurse Practitioner

## 2022-10-04 ENCOUNTER — Encounter: Payer: Self-pay | Admitting: Nurse Practitioner

## 2022-10-04 VITALS — BP 107/66 | HR 73 | Temp 97.7°F | Resp 20 | Ht 62.0 in | Wt 130.0 lb

## 2022-10-04 DIAGNOSIS — Z7689 Persons encountering health services in other specified circumstances: Secondary | ICD-10-CM

## 2022-10-04 DIAGNOSIS — E1010 Type 1 diabetes mellitus with ketoacidosis without coma: Secondary | ICD-10-CM

## 2022-10-04 MED ORDER — METOCLOPRAMIDE HCL 5 MG PO TABS: 5.0000 mg | ORAL_TABLET | Freq: Four times a day (QID) | ORAL | 1 refills | Status: AC

## 2022-10-04 NOTE — Progress Notes (Signed)
Subjective:    Patient ID: Summer Spencer, female    DOB: September 09, 1990, 32 y.o.   MRN: [id number]  Today's visit was for Transitional Care Management.  The patient was discharged from [facility] on 09/24/22 with a primary diagnosis of DKA.   Contact with the patient and/or caregiver, by a clinical staff member, was made on 09/27/22 and was documented as a telephone encounter within the EMR.  Through chart review and discussion with the patient I have determined that management of their condition is of high complexity.     Patient was admitted to hospital in DKA on 09/21/22. She had some kind of stomach bug that was causing vomiting and that affected her blood sugars. She was given IV insulin in hospital. Upon discharge her blood sugars had improved. Since going home her blood sugars have improved burt are still a little high. She says she has been very fatigued and sleeping a lot. Blood sugars at home have been running around 200. But they are btter then when she was in hospital    Review of Systems  Constitutional:  Negative for diaphoresis.  Eyes:  Negative for pain.  Respiratory:  Negative for shortness of breath.   Cardiovascular:  Negative for chest pain, palpitations and leg swelling.  Gastrointestinal:  Negative for abdominal pain.  Endocrine: Negative for polydipsia.  Skin:  Negative for rash.  Neurological:  Negative for dizziness, weakness and headaches.  Hematological:  Does not bruise/bleed easily.  All other systems reviewed and are negative.      Objective:   Physical Exam Vitals and nursing note reviewed.  Constitutional:      General: She is not in acute distress.    Appearance: Normal appearance. She is well-developed.  Neck:     Vascular: No carotid bruit or JVD.  Cardiovascular:     Rate and Rhythm: Normal rate and regular rhythm.     Heart sounds: Normal heart sounds.  Pulmonary:     Effort: Pulmonary effort is normal. No respiratory distress.      Breath sounds: Normal breath sounds. No wheezing or rales.  Chest:     Chest wall: No tenderness.  Abdominal:     General: Bowel sounds are normal. There is no distension or abdominal bruit.     Palpations: Abdomen is soft. There is no hepatomegaly, splenomegaly, mass or pulsatile mass.     Tenderness: There is no abdominal tenderness.  Musculoskeletal:        General: Normal range of motion.     Cervical back: Normal range of motion and neck supple.  Lymphadenopathy:     Cervical: No cervical adenopathy.  Skin:    General: Skin is warm and dry.  Neurological:     Mental Status: She is alert and oriented to person, place, and time.     Deep Tendon Reflexes: Reflexes are normal and symmetric.  Psychiatric:        Behavior: Behavior normal.        Thought Content: Thought content normal.        Judgment: Judgment normal.     BP 107/66   Pulse 73   Temp 97.7 F (36.5 C) (Temporal)   Resp 20   Ht 5\' 2"  (1.575 m)   Wt 130 lb (59 kg)   LMP 09/05/2022 (Approximate)   SpO2 100%   BMI 23.78 kg/m        Assessment & Plan:   Khloi Rawl [place] in today with chief complaint  of Hospitalization Follow-up   1. Diabetic ketoacidosis without coma associated with type 1 diabetes mellitus ([place]) Keep close check on blood sugars - CBC with Differential/Platelet - BMP8+EGFR  2. Encounter for support and coordination of transition of care Hospital records reviewed    The above assessment and management plan was discussed with the patient. The patient verbalized understanding of and has agreed to the management plan. Patient is aware to call the clinic if symptoms persist or worsen. Patient is aware when to return to the clinic for a follow-up visit. Patient educated on when it is appropriate to go to the emergency department.   Mary-Margaret Hassell Done, FNP

## 2022-10-04 NOTE — Patient Instructions (Signed)
Nausea, Adult Nausea is feeling like you may vomit. Feeling like you may vomit is usually not serious, but it may be an early sign of a more serious medical problem. Vomiting is when stomach contents forcefully come out of your mouth. If you vomit, or if you are not able to drink enough fluids, you may not have enough water in your body (get dehydrated). If you do not have enough water in your body, you may: Feel tired. Feel thirsty. Have a dry mouth. Have cracked lips. Pee (urinate) less often. Older adults and people who have other diseases or a weak body defense system (immune system) have a higher risk of not having enough water in the body. The main goals of treating this condition are: To relieve your nausea. To ensure your nausea occurs less often. To prevent vomiting and losing too much fluid. Follow these instructions at home: Watch your symptoms for any changes. Tell your doctor about them. Eating and drinking     Take an ORS (oral rehydration solution). This is a drink that is sold at pharmacies and stores. Drink clear fluids in small amounts as you are able. These include: Water. Ice chips. Fruit juice that has water added (diluted fruit juice). Low-calorie sports drinks. Eat bland, easy-to-digest foods in small amounts as you are able, such as: Bananas. Applesauce. Rice. Low-fat (lean) meats. Toast. Crackers. Avoid drinking fluids that have a lot of sugar or caffeine in them. This includes energy drinks, sports drinks, and soda. Avoid alcohol. Avoid spicy or fatty foods. General instructions Take over-the-counter and prescription medicines only as told by your doctor. Rest at home while you get better. Drink enough fluid to keep your pee (urine) pale yellow. Take slow and deep breaths when you feel like you may vomit. Avoid food or things that have strong smells. Wash your hands often with soap and water for at least 20 seconds. If you cannot use soap and water,  use hand sanitizer. Make sure that everyone in your home washes their hands well and often. Keep all follow-up visits. Contact a doctor if: You feel worse. You feel like you may vomit and this lasts for more than 2 days. You vomit. You are not able to drink fluids without vomiting. You have new symptoms. You have a fever. You have a headache. You have muscle cramps. You have a rash. You have pain while peeing. You feel light-headed or dizzy. Get help right away if: You have pain in your chest, neck, arm, or jaw. You feel very weak or you faint. You have vomit that is bright red or looks like coffee grounds. You have bloody or black poop (stools) or poop that looks like tar. You have a very bad headache, a stiff neck, or both. You have very bad pain, cramping, or bloating in your belly (abdomen). You have trouble breathing or you are breathing very quickly. Your heart is beating very quickly. Your skin feels cold and clammy. You feel confused. You have signs of losing too much water in your body, such as: Dark pee, very little pee, or no pee. Cracked lips. Dry mouth. Sunken eyes. Sleepiness. Weakness. These symptoms may be an emergency. Get help right away. Call 911. Do not wait to see if the symptoms will go away. Do not drive yourself to the hospital. Summary Nausea is feeling like you are about vomit. If you vomit, or if you are not able to drink enough fluids, you may not have enough water in   your body (get dehydrated). Eat and drink what your doctor tells you. Take over-the-counter and prescription medicines only as told by your doctor. Contact a doctor right away if your symptoms get worse or you have new symptoms. Keep all follow-up visits. This information is not intended to replace advice given to you by your health care provider. Make sure you discuss any questions you have with your health care provider. Document Revised: 02/20/2021 Document Reviewed:  02/20/2021 Elsevier Patient Education  2023 Elsevier Inc.  

## 2022-10-05 LAB — CBC WITH DIFFERENTIAL/PLATELET
Basophils Absolute: 0 10*3/uL (ref 0.0–0.2)
Basos: 1 %
EOS (ABSOLUTE): 0 10*3/uL (ref 0.0–0.4)
Eos: 1 %
Hematocrit: 38.5 % (ref 34.0–46.6)
Hemoglobin: 13.1 g/dL (ref 11.1–15.9)
Immature Grans (Abs): 0 10*3/uL (ref 0.0–0.1)
Immature Granulocytes: 0 %
Lymphocytes Absolute: 1.2 10*3/uL (ref 0.7–3.1)
Lymphs: 20 %
MCH: 32.1 pg (ref 26.6–33.0)
MCHC: 34 g/dL (ref 31.5–35.7)
MCV: 94 fL (ref 79–97)
Monocytes Absolute: 0.4 10*3/uL (ref 0.1–0.9)
Monocytes: 7 %
Neutrophils Absolute: 4.3 10*3/uL (ref 1.4–7.0)
Neutrophils: 71 %
Platelets: 231 10*3/uL (ref 150–450)
RBC: 4.08 x10E6/uL (ref 3.77–5.28)
RDW: 12.2 % (ref 11.7–15.4)
WBC: 6 10*3/uL (ref 3.4–10.8)

## 2022-10-05 LAB — BMP8+EGFR
BUN/Creatinine Ratio: 18 (ref 9–23)
BUN: 12 mg/dL (ref 6–20)
CO2: 22 mmol/L (ref 20–29)
Calcium: 9.3 mg/dL (ref 8.7–10.2)
Chloride: 103 mmol/L (ref 96–106)
Creatinine, Ser: 0.65 mg/dL (ref 0.57–1.00)
Glucose: 147 mg/dL — ABNORMAL HIGH (ref 70–99)
Potassium: 4.6 mmol/L (ref 3.5–5.2)
Sodium: 139 mmol/L (ref 134–144)
eGFR: 119 mL/min/{1.73_m2} (ref >59)

## 2022-10-05 NOTE — Addendum Note (Signed)
Addended by: Chevis Pretty on: 10/05/2022 01:38 PM   Modules accepted: Level of Service

## 2022-10-19 DIAGNOSIS — E10649 Type 1 diabetes mellitus with hypoglycemia without coma: Secondary | ICD-10-CM | POA: Diagnosis not present

## 2022-10-19 DIAGNOSIS — Z9641 Presence of insulin pump (external) (internal): Secondary | ICD-10-CM | POA: Diagnosis not present

## 2022-10-19 DIAGNOSIS — E1065 Type 1 diabetes mellitus with hyperglycemia: Secondary | ICD-10-CM | POA: Diagnosis not present

## 2022-10-27 ENCOUNTER — Telehealth: Payer: BC Managed Care – PPO | Admitting: Family Medicine

## 2022-10-27 DIAGNOSIS — U071 COVID-19: Secondary | ICD-10-CM | POA: Diagnosis not present

## 2022-10-27 DIAGNOSIS — J029 Acute pharyngitis, unspecified: Secondary | ICD-10-CM | POA: Diagnosis not present

## 2022-10-29 NOTE — Telephone Encounter (Signed)
Flonase nasal spray °

## 2022-11-09 ENCOUNTER — Telehealth (INDEPENDENT_AMBULATORY_CARE_PROVIDER_SITE_OTHER): Payer: BC Managed Care – PPO | Admitting: Family Medicine

## 2022-11-09 ENCOUNTER — Encounter: Payer: Self-pay | Admitting: Family Medicine

## 2022-11-09 DIAGNOSIS — H1031 Unspecified acute conjunctivitis, right eye: Secondary | ICD-10-CM

## 2022-11-09 MED ORDER — ERYTHROMYCIN 5 MG/GM OP OINT: 1.0000 | TOPICAL_OINTMENT | Freq: Three times a day (TID) | OPHTHALMIC | 1 refills | Status: AC

## 2022-11-09 NOTE — Progress Notes (Signed)
MyChart Video visit  Subjective: [id number] eye PCP: Chevis Pretty, FNP [id number] Summer Spencer is a 32 y.o. female. Patient provides verbal consent for consult held via video.  Due to COVID-19 pandemic this visit was conducted virtually. This visit type was conducted due to national recommendations for restrictions regarding the COVID-19 Pandemic (e.g. social distancing, sheltering in place) in an effort to limit this patient's exposure and mitigate transmission in our community. All issues noted in this document were discussed and addressed.  A physical exam was not performed with this format.   Location of patient: hom Location of provider: [facility] Others present for call: none  1. Pink eye Patient was diagnosed and treated for COVID 10/27/22.  She developed pink eye yesterday on right side.  Reports matting of eyelid.  Reports irritation but no visual disturbance or pain with ocular movement.  No fevers. Had some mild eyelid swelling overnight.  Family member stayed over the house over the weekend apparently had pink eye as well.   ROS: Per HPI  Allergies  Allergen Reactions   Metronidazole Nausea And Vomiting and Rash   Septra [Bactrim] Rash   Sulfamethoxazole Rash   Sulfamethoxazole-Trimethoprim Rash   Past Medical History:  Diagnosis Date   Bronchitis    Diabetes mellitus without complication (HCC)    Hypoglycemia     Current Outpatient Medications:    BAQSIMI ONE PACK 3 MG/DOSE POWD, Place 1 spray into the nose as needed (hypoglycemia)., Disp: , Rfl:    Blood Glucose Monitoring Suppl (FIFTY50 GLUCOSE METER 2.0) w/Device KIT, 1 each by Other route as directed. Uses when not using Dexcom, Disp: , Rfl:    Continuous Blood Gluc Receiver ([place]) [place], 1 each by Other route as directed. Use as directed for continuous glucose monitoring., Disp: , Rfl:    Continuous Blood Gluc Sensor (DEXCOM G6 SENSOR) MISC, 1 each by Other route as directed. Use as directed for 10  days for continuous glucose monitoring., Disp: , Rfl:    Continuous Blood Gluc Transmit (DEXCOM G6 TRANSMITTER) MISC, 1 each by Other route as directed. Use as directed for continuous glucose monitoring. Replace every 90 days., Disp: , Rfl:    diphenhydrAMINE (BENADRYL) 25 mg capsule, Take 25 mg by mouth as needed for allergies or itching., Disp: , Rfl:    glucose blood (CONTOUR NEXT TEST) test strip, 1 each by Other route 4 (four) times daily. Uses when not using Dexcom, Disp: , Rfl:    insulin degludec (TRESIBA FLEXTOUCH) 100 UNIT/ML FlexTouch Pen, Inject 13 Units into the skin at bedtime., Disp: , Rfl:    Insulin Disposable Pump (OMNIPOD 5 G6 POD, GEN 5,) MISC, Inject 1 each into the skin every 3 (three) days., Disp: , Rfl:    ketoconazole (NIZORAL) 2 % shampoo, Apply 1 Application topically once a week. [place] BODY ONCE A WEEK AS MAINTENANCE, Disp: , Rfl:    metoCLOPramide (REGLAN) 5 MG tablet, Take 1 tablet (5 mg total) by mouth 4 (four) times daily., Disp: 30 tablet, Rfl: 1   nystatin-triamcinolone (MYCOLOG II) cream, Apply 1 Application topically as needed (flare)., Disp: , Rfl:   Gen: nontoxic female, NAD HEENT: right conjunctival injection. No significant lid/ facial swelling. EOMI  Assessment/ Plan: 32 y.o. female   Acute bacterial conjunctivitis of right eye - Plan: erythromycin ophthalmic ointment Conjunctival injection appreciated.  Presumed bacterial infection. Start antibiotic ointment.  Home care instructions reviewed and reasons for reevaluation discussed. Follow up prn   Start time: 3:50pm End  time: 3:55  Total time spent on patient care (including video visit/ documentation): 5 minutes  [place], [place] [phone]

## 2022-11-10 ENCOUNTER — Telehealth: Payer: Self-pay | Admitting: Nurse Practitioner

## 2022-11-10 ENCOUNTER — Encounter: Payer: Self-pay | Admitting: Family Medicine

## 2022-11-10 ENCOUNTER — Telehealth (INDEPENDENT_AMBULATORY_CARE_PROVIDER_SITE_OTHER): Payer: BC Managed Care – PPO | Admitting: Family Medicine

## 2022-11-10 DIAGNOSIS — H6993 Unspecified Eustachian tube disorder, bilateral: Secondary | ICD-10-CM | POA: Diagnosis not present

## 2022-11-10 DIAGNOSIS — B349 Viral infection, unspecified: Secondary | ICD-10-CM | POA: Diagnosis not present

## 2022-11-10 DIAGNOSIS — R051 Acute cough: Secondary | ICD-10-CM | POA: Diagnosis not present

## 2022-11-10 DIAGNOSIS — J029 Acute pharyngitis, unspecified: Secondary | ICD-10-CM | POA: Diagnosis not present

## 2022-11-10 DIAGNOSIS — R509 Fever, unspecified: Secondary | ICD-10-CM

## 2022-11-10 DIAGNOSIS — R6889 Other general symptoms and signs: Secondary | ICD-10-CM

## 2022-11-10 NOTE — Progress Notes (Signed)
Virtual Visit via Video   I connected with patient on 11/10/22 at  1030 AM EDT by a video enabled telemedicine application and verified that I am speaking with the correct person using two identifiers.  Location patient: Home Location provider: [place] office Persons participating in the virtual visit: Patient, Provider, CMA  I discussed the limitations of evaluation and management by telemedicine and the availability of in person appointments. The patient expressed understanding and agreed to proceed.  Subjective:   HPI:   Upper Respiratory Infection: Patient complains of symptoms of a URI. Symptoms include congestion, coryza, cough, fever, sore throat, and swollen glands. Onset of symptoms was 2 days ago, unchanged since that time. She also c/o achiness, congestion, non productive cough, and post nasal drip for the past 1 day .  She is drinking plenty of fluids. Evaluation to date: seen by UC today and tested for viral illnesses, no results available. Was seen yesterday and treated for conjunctivitis. Treatment to date:  antibiotic eye drops .    ROS:   See pertinent positives and negatives per HPI.  Patient Active Problem List   Diagnosis Date Noted   DKA, type 1 ([place]) 09/21/2022   Type 1 diabetes mellitus with hyperglycemia ([place]) 03/01/2017   Type 1 diabetes mellitus ([place]) 05/18/2016    Social History   Tobacco Use   Smoking status: Former    Packs/day: 0.25    Years: 2.00    Total pack years: 0.50    Types: Cigarettes    Quit date: 12/28/2016    Years since quitting: 5.8   Smokeless tobacco: Never  Substance Use Topics   Alcohol use: Yes    Alcohol/week: 1.0 standard drink of alcohol    Types: 1 Standard drinks or equivalent per week    Current Outpatient Medications:    BAQSIMI ONE PACK 3 MG/DOSE POWD, Place 1 spray into the nose as needed (hypoglycemia)., Disp: , Rfl:    Blood Glucose Monitoring Suppl (FIFTY50 GLUCOSE METER 2.0)  w/Device KIT, 1 each by Other route as directed. Uses when not using Dexcom, Disp: , Rfl:    Continuous Blood Gluc Receiver ([place]) [place], 1 each by Other route as directed. Use as directed for continuous glucose monitoring., Disp: , Rfl:    Continuous Blood Gluc Sensor (DEXCOM G6 SENSOR) MISC, 1 each by Other route as directed. Use as directed for 10 days for continuous glucose monitoring., Disp: , Rfl:    Continuous Blood Gluc Transmit (DEXCOM G6 TRANSMITTER) MISC, 1 each by Other route as directed. Use as directed for continuous glucose monitoring. Replace every 90 days., Disp: , Rfl:    diphenhydrAMINE (BENADRYL) 25 mg capsule, Take 25 mg by mouth as needed for allergies or itching., Disp: , Rfl:    erythromycin ophthalmic ointment, Place 1 Application into the right eye 3 (three) times daily for 5 days., Disp: 3.5 g, Rfl: 1   glucose blood (CONTOUR NEXT TEST) test strip, 1 each by Other route 4 (four) times daily. Uses when not using Dexcom, Disp: , Rfl:    insulin degludec (TRESIBA FLEXTOUCH) 100 UNIT/ML FlexTouch Pen, Inject 13 Units into the skin at bedtime., Disp: , Rfl:    Insulin Disposable Pump (OMNIPOD 5 G6 POD, GEN 5,) MISC, Inject 1 each into the skin every 3 (three) days., Disp: , Rfl:    ketoconazole (NIZORAL) 2 % shampoo, Apply 1 Application topically once a week. [place] BODY ONCE A WEEK AS MAINTENANCE, Disp: ,  Rfl:    metoCLOPramide (REGLAN) 5 MG tablet, Take 1 tablet (5 mg total) by mouth 4 (four) times daily., Disp: 30 tablet, Rfl: 1   nystatin-triamcinolone (MYCOLOG II) cream, Apply 1 Application topically as needed (flare)., Disp: , Rfl:   Allergies  Allergen Reactions   Metronidazole Nausea And Vomiting and Rash   Septra [Bactrim] Rash   Sulfamethoxazole Rash   Sulfamethoxazole-Trimethoprim Rash    Objective:   There were no vitals taken for this visit as t was a virtual visit.   Patient is well-developed, well-nourished in no acute distress.  Resting  comfortably at home.  Head is normocephalic, atraumatic.  No labored breathing.  Speech is clear and coherent with logical content.  Patient is alert and oriented at baseline.  No acute distress.   Assessment and Plan:   Jomary was seen today for influenza.  Diagnoses and all orders for this visit:  Influenza-like symptoms Has been tested at [facility], results not available. Symptomatic care discussed in detail. Aware to report new, worsening, or persistent symptoms.     Monia Pouch, FNP 11/10/2022  Time spent with the patient: 11 minutes, of which >50% was spent in obtaining information about symptoms, reviewing previous labs, evaluations, and treatments, counseling about condition (please see the discussed topics above), and developing a plan to further investigate it; had a number of questions which I addressed.

## 2022-11-10 NOTE — Addendum Note (Signed)
Addended by: Baruch Gouty on: 11/10/2022 10:46 AM   Modules accepted: Level of Service

## 2022-11-15 ENCOUNTER — Encounter: Payer: Self-pay | Admitting: Family Medicine

## 2022-11-15 ENCOUNTER — Ambulatory Visit: Payer: BC Managed Care – PPO | Admitting: Family Medicine

## 2022-11-15 VITALS — BP 104/69 | HR 82 | Temp 98.4°F | Ht 62.0 in | Wt 125.1 lb

## 2022-11-15 DIAGNOSIS — J0140 Acute pansinusitis, unspecified: Secondary | ICD-10-CM | POA: Diagnosis not present

## 2022-11-15 MED ORDER — AMOXICILLIN-POT CLAVULANATE 875-125 MG PO TABS: 1.0000 | ORAL_TABLET | Freq: Two times a day (BID) | ORAL | 0 refills | Status: AC

## 2022-11-15 NOTE — Progress Notes (Signed)
Acute Office Visit  Subjective:     Patient ID: Summer Spencer, female    DOB: Mar 23, 1991, 32 y.o.   MRN: [id number]  Chief Complaint  Patient presents with   Facial Pain    Sinusitis This is a new problem. The current episode started 1 to 4 weeks ago. The problem has been gradually worsening since onset. There has been no fever. The pain is moderate. Associated symptoms include congestion, coughing, headaches, sinus pressure, sneezing and a sore throat. Pertinent negatives include no chills, diaphoresis, ear pain, hoarse voice, neck pain or shortness of breath. Treatments tried: aleve sinus and cold, dayquil. The treatment provided mild relief.   She has had a negative Covid, flu, and RSV test at [facility] on 11/10/22.  Review of Systems  Constitutional:  Negative for chills and diaphoresis.  HENT:  Positive for congestion, sinus pressure, sneezing and sore throat. Negative for ear pain and hoarse voice.   Respiratory:  Positive for cough. Negative for shortness of breath.   Musculoskeletal:  Negative for neck pain.  Neurological:  Positive for headaches.        Objective:    BP 104/69   Pulse 82   Temp 98.4 F (36.9 C) (Temporal)   Ht 5\' 2"  (1.575 m)   Wt 125 lb 2 oz (56.8 kg)   SpO2 97%   BMI 22.89 kg/m    Physical Exam Vitals and nursing note reviewed.  Constitutional:      General: She is not in acute distress.    Appearance: She is not ill-appearing, toxic-appearing or diaphoretic.  HENT:     Head: Normocephalic and atraumatic.     Right Ear: Ear canal and external ear normal. A middle ear effusion is present. Tympanic membrane is not perforated, erythematous, retracted or bulging.     Left Ear: Ear canal and external ear normal. A middle ear effusion is present. Tympanic membrane is not perforated, erythematous, retracted or bulging.     Nose: Congestion present.     Right Sinus: Maxillary sinus tenderness and frontal sinus tenderness present.     Left Sinus:  Maxillary sinus tenderness and frontal sinus tenderness present.     Mouth/Throat:     Mouth: Mucous membranes are moist.     Pharynx: Oropharynx is clear. No oropharyngeal exudate or posterior oropharyngeal erythema.  Eyes:     General:        Right eye: No discharge.        Left eye: No discharge.     Conjunctiva/sclera: Conjunctivae normal.  Cardiovascular:     Rate and Rhythm: Normal rate and regular rhythm.     Pulses: Normal pulses.     Heart sounds: Normal heart sounds.  Pulmonary:     Effort: Pulmonary effort is normal. No respiratory distress.     Breath sounds: Normal breath sounds.  Musculoskeletal:     Cervical back: Neck supple. No rigidity.     Right lower leg: No edema.     Left lower leg: No edema.  Lymphadenopathy:     Cervical: No cervical adenopathy.  Skin:    General: Skin is warm and dry.  Neurological:     General: No focal deficit present.     Mental Status: She is alert and oriented to person, place, and time.  Psychiatric:        Mood and Affect: Mood normal.        Behavior: Behavior normal.     No results found for  any visits on 11/15/22.      Assessment & Plan:   [name] was seen today for facial pain.  Diagnoses and all orders for this visit:  Acute non-recurrent pansinusitis Augmentin as below. Discussed symptomatic care and return precautions.  -     amoxicillin-clavulanate (AUGMENTIN) 875-125 MG tablet; Take 1 tablet by mouth 2 (two) times daily for 7 days.   Return if symptoms worsen or fail to improve.  The patient indicates understanding of these issues and agrees with the plan.   Gwenlyn Perking, FNP

## 2022-11-21 DIAGNOSIS — B3731 Acute candidiasis of vulva and vagina: Secondary | ICD-10-CM | POA: Diagnosis not present

## 2022-11-30 ENCOUNTER — Encounter: Payer: Self-pay | Admitting: Family Medicine

## 2022-11-30 ENCOUNTER — Telehealth (INDEPENDENT_AMBULATORY_CARE_PROVIDER_SITE_OTHER): Payer: BC Managed Care – PPO | Admitting: Family Medicine

## 2022-11-30 ENCOUNTER — Other Ambulatory Visit (HOSPITAL_COMMUNITY)
Admission: RE | Admit: 2022-11-30 | Discharge: 2022-11-30 | Disposition: A | Discharge status: Home / Self Care | Payer: BC Managed Care – PPO | Source: Ambulatory Visit | Attending: Family Medicine | Admitting: Family Medicine

## 2022-11-30 DIAGNOSIS — N898 Other specified noninflammatory disorders of vagina: Secondary | ICD-10-CM

## 2022-11-30 DIAGNOSIS — B9620 Unspecified Escherichia coli [E. coli] as the cause of diseases classified elsewhere: Secondary | ICD-10-CM

## 2022-11-30 DIAGNOSIS — N390 Urinary tract infection, site not specified: Secondary | ICD-10-CM

## 2022-11-30 LAB — WET PREP FOR TRICH, YEAST, CLUE
Clue Cell Exam: NEGATIVE
Trichomonas Exam: NEGATIVE
Yeast Exam: NEGATIVE

## 2022-11-30 LAB — URINALYSIS, ROUTINE W REFLEX MICROSCOPIC
Bilirubin, UA: NEGATIVE
Glucose, UA: NEGATIVE
Nitrite, UA: NEGATIVE
Protein,UA: NEGATIVE
RBC, UA: NEGATIVE
Specific Gravity, UA: 1.015 (ref 1.005–1.030)
Urobilinogen, Ur: 0.2 mg/dL (ref 0.2–1.0)
pH, UA: 5.5 (ref 5.0–7.5)

## 2022-11-30 LAB — MICROSCOPIC EXAMINATION
Bacteria, UA: NONE SEEN
Epithelial Cells (non renal): NONE SEEN /hpf (ref 0–10)
RBC, Urine: NONE SEEN /hpf (ref 0–2)
Renal Epithel, UA: NONE SEEN /hpf

## 2022-11-30 NOTE — Progress Notes (Signed)
Virtual Visit via Video   I connected with patient on 11/30/22 at 1500 by a video enabled telemedicine application and verified that I am speaking with the correct person using two identifiers.  Location patient: Home Location provider: [place] participating in the virtual visit: Patient and Provider  I discussed the limitations of evaluation and management by telemedicine and the availability of in person appointments. The patient expressed understanding and agreed to proceed.  Subjective:   HPI:  Pt presents today for  Chief Complaint  Patient presents with   Vaginal Itching   Pt states she was on antibiotics 2 weeks ago and was treated with 2 doses of diflucan for a yeast infection. States symptoms have improved greatly but she still has some vaginal itching at times. No discharge, bleeding, or pain. Did have dysuria for a few days but that has since resolved. No other associated symptoms.   Review of Systems  Genitourinary:  Positive for dysuria (resolved). Negative for flank pain, frequency, hematuria and urgency.       Vaginal itching. No discharge or bleeding.   All other systems reviewed and are negative.    Patient Active Problem List   Diagnosis Date Noted   DKA, type 1 09/21/2022   Type 1 diabetes mellitus with hyperglycemia 03/01/2017   Type 1 diabetes mellitus 05/18/2016    Social History   Tobacco Use   Smoking status: Former    Packs/day: 0.25    Years: 2.00    Additional pack years: 0.00    Total pack years: 0.50    Types: Cigarettes    Quit date: 12/28/2016    Years since quitting: 5.9   Smokeless tobacco: Never  Substance Use Topics   Alcohol use: Yes    Alcohol/week: 1.0 standard drink of alcohol    Types: 1 Standard drinks or equivalent per week    Current Outpatient Medications:    BAQSIMI ONE PACK 3 MG/DOSE POWD, Place 1 spray into the nose as needed (hypoglycemia)., Disp: , Rfl:    Blood Glucose  Monitoring Suppl (FIFTY50 GLUCOSE METER 2.0) w/Device KIT, 1 each by Other route as directed. Uses when not using Dexcom, Disp: , Rfl:    Continuous Blood Gluc Receiver ([place]) [place], 1 each by Other route as directed. Use as directed for continuous glucose monitoring., Disp: , Rfl:    Continuous Blood Gluc Sensor (DEXCOM G6 SENSOR) MISC, 1 each by Other route as directed. Use as directed for 10 days for continuous glucose monitoring., Disp: , Rfl:    Continuous Blood Gluc Transmit (DEXCOM G6 TRANSMITTER) MISC, 1 each by Other route as directed. Use as directed for continuous glucose monitoring. Replace every 90 days., Disp: , Rfl:    diphenhydrAMINE (BENADRYL) 25 mg capsule, Take 25 mg by mouth as needed for allergies or itching., Disp: , Rfl:    glucose blood (CONTOUR NEXT TEST) test strip, 1 each by Other route 4 (four) times daily. Uses when not using Dexcom, Disp: , Rfl:    Insulin Disposable Pump (OMNIPOD 5 G6 POD, GEN 5,) MISC, Inject 1 each into the skin every 3 (three) days., Disp: , Rfl:    ketoconazole (NIZORAL) 2 % shampoo, Apply 1 Application topically once a week. [place] BODY ONCE A WEEK AS MAINTENANCE, Disp: , Rfl:    metoCLOPramide (REGLAN) 5 MG tablet, Take 1 tablet (5 mg total) by mouth 4 (four) times daily., Disp: 30 tablet, Rfl: 1  Allergies  Allergen  Reactions   Metronidazole Nausea And Vomiting and Rash   Septra [Bactrim] Rash   Sulfamethoxazole Rash   Sulfamethoxazole-Trimethoprim Rash    Objective:   There were no vitals taken for this visit.  Patient is well-developed, well-nourished in no acute distress.  Resting comfortably at home.  Head is normocephalic, atraumatic.  No labored breathing.  Speech is clear and coherent with logical content.  Patient is alert and oriented at baseline.  No acute distress noted.   Assessment and Plan:   Dellie was seen today for vaginal itching.  Diagnoses and all orders for this visit:  Vaginal itching Wet prep  and urinalysis unremarkable. Pt aware cultures are pending and will treat if warranted. Increase water intake and avoid bladder irritants. Report new worsening, or persistent symptoms.  -     WET PREP FOR TRICH, YEAST, CLUE -     Urinalysis, Routine w reflex microscopic -     Urine cytology ancillary only -     Urine Culture      Return if symptoms worsen or fail to improve.  Monia Pouch, FNP-C [facility] [address] [place], [place] [address] [phone]  11/30/2022  Time spent with the patient: 15 minutes, of which >50% was spent in obtaining information about symptoms, reviewing previous labs, evaluations, and treatments, counseling about condition (please see the discussed topics above), and developing a plan to further investigate it; had a number of questions which I addressed.

## 2022-12-02 LAB — URINE CYTOLOGY ANCILLARY ONLY
Candida Urine: NEGATIVE
Chlamydia: NEGATIVE
Comment: NEGATIVE
Comment: NEGATIVE
Comment: NORMAL
Neisseria Gonorrhea: NEGATIVE
Trichomonas: NEGATIVE

## 2022-12-03 ENCOUNTER — Encounter: Payer: Self-pay | Admitting: Family Medicine

## 2022-12-03 LAB — URINE CULTURE

## 2022-12-03 MED ORDER — NITROFURANTOIN MONOHYD MACRO 100 MG PO CAPS: 100.0000 mg | ORAL_CAPSULE | Freq: Two times a day (BID) | ORAL | 0 refills | Status: AC

## 2022-12-03 MED ORDER — FLUCONAZOLE 150 MG PO TABS: 150.0000 mg | ORAL_TABLET | Freq: Once | ORAL | 0 refills | Status: AC

## 2022-12-03 NOTE — Addendum Note (Signed)
Addended by: Sonny Masters on: 12/03/2022 12:59 PM   Modules accepted: Orders

## 2022-12-03 NOTE — Telephone Encounter (Signed)
Macrobid usually doe snot cause yeast infection. But I sent a diflucan in just in case.  Meds ordered this encounter  Medications   fluconazole (DIFLUCAN) 150 MG tablet    Sig: Take 1 tablet (150 mg total) by mouth once for 1 dose.    Dispense:  1 tablet    Refill:  0    Order Specific Question:   Supervising Provider    Answer:   Nils Pyle [[id number]]   Mary-Margaret Daphine Deutscher, FNP

## 2023-01-10 ENCOUNTER — Ambulatory Visit (INDEPENDENT_AMBULATORY_CARE_PROVIDER_SITE_OTHER): Payer: BC Managed Care – PPO | Admitting: Nurse Practitioner

## 2023-01-10 ENCOUNTER — Encounter: Payer: Self-pay | Admitting: Nurse Practitioner

## 2023-01-10 VITALS — BP 109/66 | HR 85 | Ht 62.0 in | Wt 126.0 lb

## 2023-01-10 DIAGNOSIS — E109 Type 1 diabetes mellitus without complications: Secondary | ICD-10-CM | POA: Diagnosis not present

## 2023-01-10 DIAGNOSIS — Z0001 Encounter for general adult medical examination with abnormal findings: Secondary | ICD-10-CM | POA: Diagnosis not present

## 2023-01-10 DIAGNOSIS — Z0000 Encounter for general adult medical examination without abnormal findings: Secondary | ICD-10-CM

## 2023-01-10 LAB — BAYER DCA HB A1C WAIVED: HB A1C (BAYER DCA - WAIVED): 6.2 % — ABNORMAL HIGH (ref 4.8–5.6)

## 2023-01-10 NOTE — Progress Notes (Signed)
Subjective:    Patient ID: Summer Spencer, female    DOB: July 03, 1991, 32 y.o.   MRN: [id number]   Chief Complaint: CPE   HPI:  Summer Spencer is a 32 y.o. who identifies as a female who was assigned female at birth.   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 She is doing well today without complaints. She is an insulin dependent diabetic and see endocrinology. Had to be hospitalized in DKA erlier in the year but has been doing well since discharge.  There are no diagnoses linked to this encounter.  New complaints: None today  Allergies  Allergen Reactions   Metronidazole Nausea And Vomiting and Rash   Septra [Bactrim] Rash   Sulfamethoxazole Rash   Sulfamethoxazole-Trimethoprim Rash   Outpatient Encounter Medications as of 01/10/2023  Medication Sig   BAQSIMI ONE PACK 3 MG/DOSE POWD Place 1 spray into the nose as needed (hypoglycemia).   Blood Glucose Monitoring Suppl (FIFTY50 GLUCOSE METER 2.0) w/Device KIT 1 each by Other route as directed. Uses when not using Dexcom   Continuous Blood Gluc Receiver (DEXCOM G6 RECEIVER) [place] 1 each by Other route as directed. Use as directed for continuous glucose monitoring.   Continuous Blood Gluc Sensor (DEXCOM G6 SENSOR) MISC 1 each by Other route as directed. Use as directed for 10 days for continuous glucose monitoring.   Continuous Blood Gluc Transmit (DEXCOM G6 TRANSMITTER) MISC 1 each by Other route as directed. Use as directed for continuous glucose monitoring. Replace every 90 days.   diphenhydrAMINE (BENADRYL) 25 mg capsule Take 25 mg by mouth as needed for allergies or itching.   glucose blood (CONTOUR NEXT TEST) test strip 1 each by Other route 4 (four) times daily. Uses when not using Dexcom   Insulin Disposable Pump (OMNIPOD 5 G6 POD, GEN 5,) MISC Inject 1 each into the skin every 3 (three) days.   ketoconazole (NIZORAL) 2 % shampoo Apply 1  Application topically once a week. WASH BODY ONCE A WEEK AS MAINTENANCE   metoCLOPramide (REGLAN) 5 MG tablet Take 1 tablet (5 mg total) by mouth 4 (four) times daily.   No facility-administered encounter medications on file as of 01/10/2023.    Past Surgical History:  Procedure Laterality Date   hymen     WISDOM TOOTH EXTRACTION      Family History  Problem Relation Age of Onset   Hypertension Father    Diabetes Maternal Grandmother    Liver disease Maternal Grandfather    Hypertension Paternal Grandmother    Hypertension Other    Hyperlipidemia Other    Anesthesia problems Neg Hx       Controlled substance contract: n/a      Review of Systems  Constitutional:  Negative for diaphoresis.  Eyes:  Negative for pain.  Respiratory:  Negative for shortness of breath.   Cardiovascular:  Negative for chest pain, palpitations and leg swelling.  Gastrointestinal:  Negative for abdominal pain.  Endocrine: Negative for polydipsia.  Skin:  Negative for rash.  Neurological:  Negative for dizziness, weakness and headaches.  Hematological:  Does not bruise/bleed easily.  All other systems reviewed and are negative.      Objective:   Physical Exam Vitals and nursing note reviewed.  Constitutional:      General: She is not in acute distress.    Appearance: Normal appearance. She is well-developed.  HENT:  Head: Normocephalic.     Right Ear: Tympanic membrane normal.     Left Ear: Tympanic membrane normal.     Nose: Nose normal.     Mouth/Throat:     Mouth: Mucous membranes are moist.  Eyes:     Pupils: Pupils are equal, round, and reactive to light.  Neck:     Vascular: No carotid bruit or JVD.  Cardiovascular:     Rate and Rhythm: Normal rate and regular rhythm.     Heart sounds: Normal heart sounds.  Pulmonary:     Effort: Pulmonary effort is normal. No respiratory distress.     Breath sounds: Normal breath sounds. No wheezing or rales.  Chest:     Chest wall:  No tenderness.  Abdominal:     General: Bowel sounds are normal. There is no distension or abdominal bruit.     Palpations: Abdomen is soft. There is no hepatomegaly, splenomegaly, mass or pulsatile mass.     Tenderness: There is no abdominal tenderness.  Musculoskeletal:        General: Normal range of motion.     Cervical back: Normal range of motion and neck supple.  Lymphadenopathy:     Cervical: No cervical adenopathy.  Skin:    General: Skin is warm and dry.  Neurological:     Mental Status: She is alert and oriented to person, place, and time.     Deep Tendon Reflexes: Reflexes are normal and symmetric.  Psychiatric:        Behavior: Behavior normal.        Thought Content: Thought content normal.        Judgment: Judgment normal.     BP 109/66   Pulse 85   Ht 5\' 2"  (1.575 m)   Wt 126 lb (57.2 kg)   SpO2 97%   BMI 23.05 kg/m          Assessment & Plan:  Summer Spencer in today with chief complaint of Medical Management of Chronic Issues (CPE) and Diabetes   1. Annual physical exam Labs pending  2. Type 1 diabetes mellitus without complication (HCC) Continue to watch carbs in diet - Bayer DCA Hb A1c Waived - CBC with Differential/Platelet - CMP14+EGFR - Lipid panel - Thyroid Panel With TSH    The above assessment and management plan was discussed with the patient. The patient verbalized understanding of and has agreed to the management plan. Patient is aware to call the clinic if symptoms persist or worsen. Patient is aware when to return to the clinic for a follow-up visit. Patient educated on when it is appropriate to go to the emergency department.   Mary-Margaret Daphine Deutscher, FNP

## 2023-01-10 NOTE — Patient Instructions (Signed)
Exercising to Stay Healthy To become healthy and stay healthy, it is recommended that you do moderate-intensity and vigorous-intensity exercise. You can tell that you are exercising at a moderate intensity if your heart starts beating faster and you start breathing faster but can still hold a conversation. You can tell that you are exercising at a vigorous intensity if you are breathing much harder and faster and cannot hold a conversation while exercising. How can exercise benefit me? Exercising regularly is important. It has many health benefits, such as: Improving overall fitness, flexibility, and endurance. Increasing bone density. Helping with weight control. Decreasing body fat. Increasing muscle strength and endurance. Reducing stress and tension, anxiety, depression, or anger. Improving overall health. What guidelines should I follow while exercising? Before you start a new exercise program, talk with your health care provider. Do not exercise so much that you hurt yourself, feel dizzy, or get very short of breath. Wear comfortable clothes and wear shoes with good support. Drink plenty of water while you exercise to prevent dehydration or heat stroke. Work out until your breathing and your heartbeat get faster (moderate intensity). How often should I exercise? Choose an activity that you enjoy, and set realistic goals. Your health care provider can help you make an activity plan that is individually designed and works best for you. Exercise regularly as told by your health care provider. This may include: Doing strength training two times a week, such as: Lifting weights. Using resistance bands. Push-ups. Sit-ups. Yoga. Doing a certain intensity of exercise for a given amount of time. Choose from these options: A total of 150 minutes of moderate-intensity exercise every week. A total of 75 minutes of vigorous-intensity exercise every week. A mix of moderate-intensity and  vigorous-intensity exercise every week. Children, pregnant women, people who have not exercised regularly, people who are overweight, and older adults may need to talk with a health care provider about what activities are safe to perform. If you have a medical condition, be sure to talk with your health care provider before you start a new exercise program. What are some exercise ideas? Moderate-intensity exercise ideas include: Walking 1 mile (1.6 km) in about 15 minutes. Biking. Hiking. Golfing. Dancing. Water aerobics. Vigorous-intensity exercise ideas include: Walking 4.5 miles (7.2 km) or more in about 1 hour. Jogging or running 5 miles (8 km) in about 1 hour. Biking 10 miles (16.1 km) or more in about 1 hour. Lap swimming. Roller-skating or in-line skating. Cross-country skiing. Vigorous competitive sports, such as football, basketball, and soccer. Jumping rope. Aerobic dancing. What are some everyday activities that can help me get exercise? Yard work, such as: Pushing a lawn mower. Raking and bagging leaves. Washing your car. Pushing a stroller. Shoveling snow. Gardening. Washing windows or floors. How can I be more active in my day-to-day activities? Use stairs instead of an elevator. Take a walk during your lunch break. If you drive, park your car farther away from your work or school. If you take public transportation, get off one stop early and walk the rest of the way. Stand up or walk around during all of your indoor phone calls. Get up, stretch, and walk around every 30 minutes throughout the day. Enjoy exercise with a friend. Support to continue exercising will help you keep a regular routine of activity. Where to find more information You can find more information about exercising to stay healthy from: U.S. Department of Health and Human Services: www.hhs.gov Centers for Disease Control and Prevention (  CDC): www.cdc.gov Summary Exercising regularly is  important. It will improve your overall fitness, flexibility, and endurance. Regular exercise will also improve your overall health. It can help you control your weight, reduce stress, and improve your bone density. Do not exercise so much that you hurt yourself, feel dizzy, or get very short of breath. Before you start a new exercise program, talk with your health care provider. This information is not intended to replace advice given to you by your health care provider. Make sure you discuss any questions you have with your health care provider. Document Revised: 12/12/2020 Document Reviewed: 12/12/2020 Elsevier Patient Education  2023 [employer] Inc.  

## 2023-01-11 LAB — CMP14+EGFR
ALT: 18 IU/L (ref 0–32)
AST: 13 IU/L (ref 0–40)
Albumin/Globulin Ratio: 2.3 — ABNORMAL HIGH (ref 1.2–2.2)
Albumin: 4.4 g/dL (ref 3.9–4.9)
Alkaline Phosphatase: 68 IU/L (ref 44–121)
BUN/Creatinine Ratio: 11 (ref 9–23)
BUN: 8 mg/dL (ref 6–20)
Bilirubin Total: 0.4 mg/dL (ref 0.0–1.2)
CO2: 21 mmol/L (ref 20–29)
Calcium: 9.4 mg/dL (ref 8.7–10.2)
Chloride: 104 mmol/L (ref 96–106)
Creatinine, Ser: 0.7 mg/dL (ref 0.57–1.00)
Globulin, Total: 1.9 g/dL (ref 1.5–4.5)
Glucose: 49 mg/dL — ABNORMAL LOW (ref 70–99)
Potassium: 4.1 mmol/L (ref 3.5–5.2)
Sodium: 139 mmol/L (ref 134–144)
Total Protein: 6.3 g/dL (ref 6.0–8.5)
eGFR: 117 mL/min/{1.73_m2} (ref >59)

## 2023-01-11 LAB — CBC WITH DIFFERENTIAL/PLATELET
Basophils Absolute: 0.1 10*3/uL (ref 0.0–0.2)
Basos: 1 %
EOS (ABSOLUTE): 0.1 10*3/uL (ref 0.0–0.4)
Eos: 1 %
Hematocrit: 37.4 % (ref 34.0–46.6)
Hemoglobin: 12.4 g/dL (ref 11.1–15.9)
Immature Grans (Abs): 0 10*3/uL (ref 0.0–0.1)
Immature Granulocytes: 0 %
Lymphocytes Absolute: 2.3 10*3/uL (ref 0.7–3.1)
Lymphs: 32 %
MCH: 31.6 pg (ref 26.6–33.0)
MCHC: 33.2 g/dL (ref 31.5–35.7)
MCV: 95 fL (ref 79–97)
Monocytes Absolute: 0.6 10*3/uL (ref 0.1–0.9)
Monocytes: 8 %
Neutrophils Absolute: 4.3 10*3/uL (ref 1.4–7.0)
Neutrophils: 58 %
Platelets: 197 10*3/uL (ref 150–450)
RBC: 3.93 x10E6/uL (ref 3.77–5.28)
RDW: 11.6 % — ABNORMAL LOW (ref 11.7–15.4)
WBC: 7.4 10*3/uL (ref 3.4–10.8)

## 2023-01-11 LAB — THYROID PANEL WITH TSH
Free Thyroxine Index: 1.5 (ref 1.2–4.9)
T3 Uptake Ratio: 26 % (ref 24–39)
T4, Total: 5.6 ug/dL (ref 4.5–12.0)
TSH: 1.41 u[IU]/mL (ref 0.450–4.500)

## 2023-01-11 LAB — LIPID PANEL
Chol/HDL Ratio: 2.1 ratio (ref 0.0–4.4)
Cholesterol, Total: 155 mg/dL (ref 100–199)
HDL: 73 mg/dL (ref >39)
LDL Chol Calc (NIH): 72 mg/dL (ref 0–99)
Triglycerides: 45 mg/dL (ref 0–149)
VLDL Cholesterol Cal: 10 mg/dL (ref 5–40)

## 2023-01-26 LAB — HM DIABETES EYE EXAM

## 2023-01-31 DIAGNOSIS — E1065 Type 1 diabetes mellitus with hyperglycemia: Secondary | ICD-10-CM | POA: Diagnosis not present

## 2023-01-31 DIAGNOSIS — E10649 Type 1 diabetes mellitus with hypoglycemia without coma: Secondary | ICD-10-CM | POA: Diagnosis not present

## 2023-01-31 DIAGNOSIS — E559 Vitamin D deficiency, unspecified: Secondary | ICD-10-CM | POA: Diagnosis not present

## 2023-02-01 DIAGNOSIS — Z978 Presence of other specified devices: Secondary | ICD-10-CM | POA: Diagnosis not present

## 2023-02-01 DIAGNOSIS — E10649 Type 1 diabetes mellitus with hypoglycemia without coma: Secondary | ICD-10-CM | POA: Diagnosis not present

## 2023-02-01 DIAGNOSIS — E1065 Type 1 diabetes mellitus with hyperglycemia: Secondary | ICD-10-CM | POA: Diagnosis not present

## 2023-02-01 DIAGNOSIS — Z9641 Presence of insulin pump (external) (internal): Secondary | ICD-10-CM | POA: Diagnosis not present

## 2023-02-14 DIAGNOSIS — R109 Unspecified abdominal pain: Secondary | ICD-10-CM | POA: Diagnosis not present

## 2023-02-14 DIAGNOSIS — R1032 Left lower quadrant pain: Secondary | ICD-10-CM | POA: Diagnosis not present

## 2023-02-17 DIAGNOSIS — R1032 Left lower quadrant pain: Secondary | ICD-10-CM | POA: Diagnosis not present

## 2023-05-06 DIAGNOSIS — Z124 Encounter for screening for malignant neoplasm of cervix: Secondary | ICD-10-CM | POA: Diagnosis not present

## 2023-05-06 DIAGNOSIS — Z01419 Encounter for gynecological examination (general) (routine) without abnormal findings: Secondary | ICD-10-CM | POA: Diagnosis not present

## 2023-05-06 DIAGNOSIS — Z6822 Body mass index (BMI) 22.0-22.9, adult: Secondary | ICD-10-CM | POA: Diagnosis not present

## 2023-05-06 DIAGNOSIS — Z1151 Encounter for screening for human papillomavirus (HPV): Secondary | ICD-10-CM | POA: Diagnosis not present

## 2023-05-09 LAB — HM PAP SMEAR: HPV, high-risk: NEGATIVE

## 2023-07-04 ENCOUNTER — Telehealth: Payer: Self-pay | Admitting: Nurse Practitioner

## 2023-07-06 ENCOUNTER — Telehealth: Payer: Self-pay | Admitting: *Deleted

## 2023-07-06 NOTE — Telephone Encounter (Signed)
Copied from CRM [id number]. Topic: General - Other >> Jul 06, 2023 10:21 AM Summer Spencer wrote: Reason for CRM: Patient received a call requesting information on her last eye exam, she would like to speak to the clinical staff to provide the information.

## 2023-07-06 NOTE — Telephone Encounter (Signed)
Spoke with pt she stated she had her diabetic eye exam already at [facility], will send them request for results

## 2023-07-07 ENCOUNTER — Ambulatory Visit: Payer: BC Managed Care – PPO | Admitting: Family Medicine

## 2023-07-07 ENCOUNTER — Encounter: Payer: Self-pay | Admitting: Family Medicine

## 2023-07-07 VITALS — BP 97/67 | HR 74 | Temp 98.9°F | Ht 62.0 in | Wt 125.0 lb

## 2023-07-07 DIAGNOSIS — J029 Acute pharyngitis, unspecified: Secondary | ICD-10-CM

## 2023-07-07 DIAGNOSIS — B9689 Other specified bacterial agents as the cause of diseases classified elsewhere: Secondary | ICD-10-CM | POA: Diagnosis not present

## 2023-07-07 DIAGNOSIS — J019 Acute sinusitis, unspecified: Secondary | ICD-10-CM

## 2023-07-07 LAB — RAPID STREP SCREEN (MED CTR MEBANE ONLY): Strep Gp A Ag, IA W/Reflex: NEGATIVE

## 2023-07-07 LAB — CULTURE, GROUP A STREP

## 2023-07-07 MED ORDER — CEFDINIR 300 MG PO CAPS
300.0000 mg | ORAL_CAPSULE | Freq: Two times a day (BID) | ORAL | 0 refills | Status: AC
Start: 2023-07-07 — End: 2023-07-14

## 2023-07-07 NOTE — Progress Notes (Signed)
Subjective:  Patient ID: Summer Spencer, female    DOB: 11/06/1990, 32 y.o.   MRN: [id number]  Patient Care Team: Bennie Pierini, FNP as PCP - General (Family Medicine) Ob/Gyn, [facility]   Chief Complaint:  chest congestion (Sore throat/Fever/Fatigue/Cough, productive)  HPI: Summer Spencer is a 32 y.o. female presenting on 07/07/2023 for chest congestion (Sore throat/Fever/Fatigue/Cough, productive) States that symptoms started last Friday, one week ago primarily with sore throat. Reports rhinorrhea. Reportos headaches, fatigue, cough with production that was clear until this morning now green. Reports that Tuesday felt better. Wednesday worse than before with a low grade fever with Tmax of 100.2. States that she has tried honey syrup, oregano oil, ginger lemon tea, Tylenol intermittently and she did take an allergy pill last night.   Relevant past medical, surgical, family, and social history reviewed and updated as indicated.  Allergies and medications reviewed and updated. Data reviewed: Chart in Epic.   Past Medical History:  Diagnosis Date   Bronchitis    Diabetes mellitus without complication (HCC)    Hypoglycemia     Past Surgical History:  Procedure Laterality Date   hymen     WISDOM TOOTH EXTRACTION      Social History   Socioeconomic History   Marital status: Married    Spouse name: Not on file   Number of children: Not on file   Years of education: Not on file   Highest education level: Some college, no degree  Occupational History   Not on file  Tobacco Use   Smoking status: Former    Current packs/day: 0.00    Average packs/day: 0.3 packs/day for 2.0 years (0.5 ttl pk-yrs)    Types: Cigarettes    Start date: 12/29/2014    Quit date: 12/28/2016    Years since quitting: 6.5   Smokeless tobacco: Never  Vaping Use   Vaping status: Never Used  Substance and Sexual Activity   Alcohol use: Yes    Alcohol/week: 1.0 standard drink of alcohol     Types: 1 Standard drinks or equivalent per week   Drug use: No   Sexual activity: Yes  Other Topics Concern   Not on file  Social History Narrative   Not on file   Social Determinants of Health   Financial Resource Strain: Low Risk  (07/06/2023)   Overall Financial Resource Strain (CARDIA)    Difficulty of Paying Living Expenses: Not very hard  Food Insecurity: No Food Insecurity (07/06/2023)   Hunger Vital Sign    Worried About Running Out of Food in the Last Year: Never true    Ran Out of Food in the Last Year: Never true  Transportation Needs: No Transportation Needs (07/06/2023)   PRAPARE - Administrator, Civil Service (Medical): No    Lack of Transportation (Non-Medical): No  Physical Activity: Insufficiently Active (07/06/2023)   Exercise Vital Sign    Days of Exercise per Week: 3 days    Minutes of Exercise per Session: 30 min  Stress: No Stress Concern Present (07/06/2023)   [employer] of Occupational Health - Occupational Stress Questionnaire    Feeling of Stress : Not at all  Social Connections: Socially Integrated (07/06/2023)   Social Connection and Isolation Panel [NHANES]    Frequency of Communication with Friends and Family: More than three times a week    Frequency of Social Gatherings with Friends and Family: More than three times a week    Attends  Religious Services: More than 4 times per year    Active Member of Clubs or Organizations: Yes    Attends Banker Meetings: More than 4 times per year    Marital Status: Married  Catering manager Violence: Not At Risk (09/23/2022)   Humiliation, Afraid, Rape, and Kick questionnaire    Fear of Current or Ex-Partner: No    Emotionally Abused: No    Physically Abused: No    Sexually Abused: No    Outpatient Encounter Medications as of 07/07/2023  Medication Sig   NOVOLOG 100 UNIT/ML injection SMARTSIG:0-30 Unit(s) SUB-Q Daily   BAQSIMI ONE PACK 3 MG/DOSE POWD Place 1 spray into the nose  as needed (hypoglycemia).   Blood Glucose Monitoring Suppl (FIFTY50 GLUCOSE METER 2.0) w/Device KIT 1 each by Other route as directed. Uses when not using Dexcom   Continuous Blood Gluc Receiver (DEXCOM G6 RECEIVER) DEVI 1 each by Other route as directed. Use as directed for continuous glucose monitoring.   Continuous Blood Gluc Sensor (DEXCOM G6 SENSOR) MISC 1 each by Other route as directed. Use as directed for 10 days for continuous glucose monitoring.   Continuous Blood Gluc Transmit (DEXCOM G6 TRANSMITTER) MISC 1 each by Other route as directed. Use as directed for continuous glucose monitoring. Replace every 90 days.   diphenhydrAMINE (BENADRYL) 25 mg capsule Take 25 mg by mouth as needed for allergies or itching.   glucose blood (CONTOUR NEXT TEST) test strip 1 each by Other route 4 (four) times daily. Uses when not using Dexcom   Insulin Disposable Pump (OMNIPOD 5 G6 POD, GEN 5,) MISC Inject 1 each into the skin every 3 (three) days.   metoCLOPramide (REGLAN) 5 MG tablet Take 1 tablet (5 mg total) by mouth 4 (four) times daily.   TRESIBA FLEXTOUCH 100 UNIT/ML FlexTouch Pen SMARTSIG:15 Unit(s) SUB-Q Daily   [DISCONTINUED] ketoconazole (NIZORAL) 2 % shampoo Apply 1 Application topically once a week. WASH BODY ONCE A WEEK AS MAINTENANCE   No facility-administered encounter medications on file as of 07/07/2023.    Allergies  Allergen Reactions   Metronidazole Nausea And Vomiting and Rash   Septra [Bactrim] Rash   Sulfamethoxazole Rash   Sulfamethoxazole-Trimethoprim Rash    Review of Systems As per HPI  Objective:  BP 95/60   Pulse 74   Temp 98.9 F (37.2 C)   Ht 5\' 2"  (1.575 m)   Wt 125 lb (56.7 kg)   SpO2 96%   BMI 22.86 kg/m    Wt Readings from Last 3 Encounters:  07/07/23 125 lb (56.7 kg)  01/10/23 126 lb (57.2 kg)  11/15/22 125 lb 2 oz (56.8 kg)   Physical Exam Constitutional:      General: She is awake. She is not in acute distress.    Appearance: Normal  appearance. She is well-developed and well-groomed. She is not ill-appearing, toxic-appearing or diaphoretic.  HENT:     Right Ear: No decreased hearing noted. No laceration, drainage, swelling or tenderness. A middle ear effusion is present. There is no impacted cerumen. No foreign body. No mastoid tenderness. No PE tube. No hemotympanum. Tympanic membrane is not injected, scarred, perforated, erythematous, retracted or bulging.     Left Ear: No decreased hearing noted. No laceration, drainage, swelling or tenderness. A middle ear effusion is present. There is no impacted cerumen. No foreign body. No mastoid tenderness. No PE tube. No hemotympanum. Tympanic membrane is not injected, scarred, perforated, erythematous, retracted or bulging.     Nose:  Congestion and rhinorrhea present. Rhinorrhea is clear.     Right Sinus: Frontal sinus tenderness present. No maxillary sinus tenderness.     Left Sinus: Frontal sinus tenderness present. No maxillary sinus tenderness.     Mouth/Throat:     Lips: Pink. No lesions.     Mouth: Mucous membranes are moist.     Tongue: No lesions. Tongue does not deviate from midline.     Palate: No mass and lesions.     Pharynx: Posterior oropharyngeal erythema and postnasal drip present. No pharyngeal swelling or oropharyngeal exudate.     Tonsils: Tonsillar exudate present. 1+ on the right. 1+ on the left.   Cardiovascular:     Rate and Rhythm: Normal rate and regular rhythm.     Pulses: Normal pulses.          Radial pulses are 2+ on the right side and 2+ on the left side.       Posterior tibial pulses are 2+ on the right side and 2+ on the left side.     Heart sounds: Normal heart sounds. No murmur heard.    No gallop.  Pulmonary:     Effort: Pulmonary effort is normal. No respiratory distress.     Breath sounds: Normal breath sounds. No stridor. No wheezing, rhonchi or rales.  Musculoskeletal:     Cervical back: Full passive range of motion without pain and  neck supple.     Right lower leg: No edema.     Left lower leg: No edema.  Lymphadenopathy:     Head:     Right side of head: No submental, submandibular, tonsillar, preauricular or posterior auricular adenopathy.     Left side of head: Tonsillar adenopathy present. No submental, submandibular, preauricular or posterior auricular adenopathy.     Cervical: Cervical adenopathy present.     Right cervical: Superficial cervical adenopathy present. No deep cervical adenopathy.    Left cervical: No superficial or deep cervical adenopathy.  Skin:    General: Skin is warm.     Capillary Refill: Capillary refill takes less than 2 seconds.  Neurological:     General: No focal deficit present.     Mental Status: She is alert, oriented to person, place, and time and easily aroused. Mental status is at baseline.     GCS: GCS eye subscore is 4. GCS verbal subscore is 5. GCS motor subscore is 6.     Motor: No weakness.  Psychiatric:        Attention and Perception: Attention and perception normal.        Mood and Affect: Mood and affect normal.        Speech: Speech normal.        Behavior: Behavior normal. Behavior is cooperative.        Thought Content: Thought content normal. Thought content does not include homicidal or suicidal ideation. Thought content does not include homicidal or suicidal plan.        Cognition and Memory: Cognition and memory normal.        Judgment: Judgment normal.     Results for orders placed or performed in visit on 01/10/23  Bayer DCA Hb A1c Waived  Result Value Ref Range   HB A1C (BAYER DCA - WAIVED) 6.2 (H) 4.8 - 5.6 %  CBC with Differential/Platelet  Result Value Ref Range   WBC 7.4 3.4 - 10.8 x10E3/uL   RBC 3.93 3.77 - 5.28 x10E6/uL   Hemoglobin 12.4 11.1 - 15.9  g/dL   Hematocrit 16.1 09.6 - 46.6 %   MCV 95 79 - 97 fL   MCH 31.6 26.6 - 33.0 pg   MCHC 33.2 31.5 - 35.7 g/dL   RDW 04.5 (L) 40.9 - 81.1 %   Platelets 197 150 - 450 x10E3/uL   Neutrophils 58  Not Estab. %   Lymphs 32 Not Estab. %   Monocytes 8 Not Estab. %   Eos 1 Not Estab. %   Basos 1 Not Estab. %   Neutrophils Absolute 4.3 1.4 - 7.0 x10E3/uL   Lymphocytes Absolute 2.3 0.7 - 3.1 x10E3/uL   Monocytes Absolute 0.6 0.1 - 0.9 x10E3/uL   EOS (ABSOLUTE) 0.1 0.0 - 0.4 x10E3/uL   Basophils Absolute 0.1 0.0 - 0.2 x10E3/uL   Immature Granulocytes 0 Not Estab. %   Immature Grans (Abs) 0.0 0.0 - 0.1 x10E3/uL  CMP14+EGFR  Result Value Ref Range   Glucose 49 (L) 70 - 99 mg/dL   BUN 8 6 - 20 mg/dL   Creatinine, Ser 9.14 0.57 - 1.00 mg/dL   eGFR 782 >95 AO/ZHY/8.65   BUN/Creatinine Ratio 11 9 - 23   Sodium 139 134 - 144 mmol/L   Potassium 4.1 3.5 - 5.2 mmol/L   Chloride 104 96 - 106 mmol/L   CO2 21 20 - 29 mmol/L   Calcium 9.4 8.7 - 10.2 mg/dL   Total Protein 6.3 6.0 - 8.5 g/dL   Albumin 4.4 3.9 - 4.9 g/dL   Globulin, Total 1.9 1.5 - 4.5 g/dL   Albumin/Globulin Ratio 2.3 (H) 1.2 - 2.2   Bilirubin Total 0.4 0.0 - 1.2 mg/dL   Alkaline Phosphatase 68 44 - 121 IU/L   AST 13 0 - 40 IU/L   ALT 18 0 - 32 IU/L  Lipid panel  Result Value Ref Range   Cholesterol, Total 155 100 - 199 mg/dL   Triglycerides 45 0 - 149 mg/dL   HDL 73 >78 mg/dL   VLDL Cholesterol Cal 10 5 - 40 mg/dL   LDL Chol Calc (NIH) 72 0 - 99 mg/dL   Chol/HDL Ratio 2.1 0.0 - 4.4 ratio  Thyroid Panel With TSH  Result Value Ref Range   TSH 1.410 0.450 - 4.500 uIU/mL   T4, Total 5.6 4.5 - 12.0 ug/dL   T3 Uptake Ratio 26 24 - 39 %   Free Thyroxine Index 1.5 1.2 - 4.9       01/10/2023    2:53 PM 11/15/2022    9:09 AM 10/04/2022   11:07 AM 11/27/2021    9:29 AM 10/12/2018   11:36 AM  Depression screen PHQ 2/9  Decreased Interest 0 0 0 0 0  Down, Depressed, Hopeless 0 0 0 0 0  PHQ - 2 Score 0 0 0 0 0  Altered sleeping 0 0 0 0   Tired, decreased energy 0 0 0 0   Change in appetite 0 0 0 0   Feeling bad or failure about yourself  0 0 0 0   Trouble concentrating 0 0 0 0   Moving slowly or fidgety/restless 0 0 0 0    Suicidal thoughts 0 0 0 0   PHQ-9 Score 0 0 0 0   Difficult doing work/chores Not difficult at all Not difficult at all Not difficult at all Not difficult at all        01/10/2023    3:00 PM 11/15/2022    9:09 AM 10/04/2022   11:07 AM 11/27/2021  9:30 AM  GAD 7 : Generalized Anxiety Score  Nervous, Anxious, on Edge 0 0 0 0  Control/stop worrying 0 0 0 0  Worry too much - different things 0 0 0 0  Trouble relaxing 0 0 0 0  Restless 0 0 0 0  Easily annoyed or irritable 0 0 0 0  Afraid - awful might happen 0 0 0 0  Total GAD 7 Score 0 0 0 0  Anxiety Difficulty Not difficult at all Not difficult at all Not difficult at all Not difficult at all      Pertinent labs & imaging results that were available during my care of the patient were reviewed by me and considered in my medical decision making.  Assessment & Plan:  Summer Spencer was seen today for chest congestion.  Diagnoses and all orders for this visit:  Acute bacterial rhinosinusitis Will start medication as below. Discussed OTC at home supportive therapies to continue such as mucinex, tylenol and teas.  -     cefdinir (OMNICEF) 300 MG capsule; Take 1 capsule (300 mg total) by mouth 2 (two) times daily for 7 days. 1 po BID  Sore throat Rapid strep negative. Will treat as above. Will extend course of cefdinir if culture results positive.  -     Rapid Strep Screen ([facility] ONLY); Future -     Culture, Group A Strep; Future -     Rapid Strep Screen ([facility] ONLY) -     Culture, Group A Strep  Continue all other maintenance medications.  Follow up plan: Return if symptoms worsen or fail to improve.  Continue healthy lifestyle choices, including diet (rich in fruits, vegetables, and lean proteins, and low in salt and simple carbohydrates) and exercise (at least 30 minutes of moderate physical activity daily).  Written and verbal instructions provided   The above assessment and management plan was discussed with the  patient. The patient verbalized understanding of and has agreed to the management plan. Patient is aware to call the clinic if they develop any new symptoms or if symptoms persist or worsen. Patient is aware when to return to the clinic for a follow-up visit. Patient educated on when it is appropriate to go to the emergency department.   Neale Burly, DNP-FNP [facility] [address] [place], [place] [address] [phone]

## 2023-07-09 LAB — CULTURE, GROUP A STREP: Strep A Culture: NEGATIVE

## 2023-07-12 NOTE — Progress Notes (Signed)
Negative strep. Continue current regimen. Follow up if symptoms continue.

## 2023-08-18 NOTE — Telephone Encounter (Signed)
Abstraction complete - updated care team

## 2023-10-03 ENCOUNTER — Ambulatory Visit (INDEPENDENT_AMBULATORY_CARE_PROVIDER_SITE_OTHER): Payer: BC Managed Care – PPO | Admitting: Family

## 2023-10-03 VITALS — BP 101/62 | HR 79 | Temp 98.7°F | Ht 62.0 in | Wt 124.4 lb

## 2023-10-03 DIAGNOSIS — Z20828 Contact with and (suspected) exposure to other viral communicable diseases: Secondary | ICD-10-CM

## 2023-10-03 DIAGNOSIS — R509 Fever, unspecified: Secondary | ICD-10-CM | POA: Diagnosis not present

## 2023-10-03 DIAGNOSIS — R6889 Other general symptoms and signs: Secondary | ICD-10-CM | POA: Diagnosis not present

## 2023-10-03 LAB — VERITOR FLU A/B WAIVED
Influenza A: NEGATIVE
Influenza B: NEGATIVE

## 2023-10-03 MED ORDER — OSELTAMIVIR PHOSPHATE 75 MG PO CAPS: 75.0000 mg | ORAL_CAPSULE | Freq: Two times a day (BID) | ORAL | 0 refills | Status: DC

## 2023-10-03 NOTE — Patient Instructions (Signed)

## 2023-10-03 NOTE — Progress Notes (Signed)
Subjective:    Patient ID: Essa Wenk Dotter, female    DOB: [birth year], 33 y.o.   MRN: [id number]  Chief Complaint  Patient presents with   Sore Throat   Cough   Headache    BODY    Generalized Body Aches    EXPOSED TO THE FLU   Pt presents to the office today with flu like symptoms that started yesterday.  Sore Throat  Associated symptoms include coughing and headaches. Pertinent negatives include no ear pain or shortness of breath.  Cough This is a new problem. The current episode started yesterday. The cough is Non-productive. Associated symptoms include a fever (99 F), headaches, myalgias, nasal congestion, postnasal drip and a sore throat. Pertinent negatives include no chills, ear congestion, ear pain, shortness of breath or wheezing. She has tried rest (tylenol) for the symptoms.  Headache  Associated symptoms include coughing, a fever (99 F) and a sore throat. Pertinent negatives include no ear pain.      Review of Systems  Constitutional:  Positive for fever (99 F). Negative for chills.  HENT:  Positive for postnasal drip and sore throat. Negative for ear pain.   Respiratory:  Positive for cough. Negative for shortness of breath and wheezing.   Musculoskeletal:  Positive for myalgias.  Neurological:  Positive for headaches.  All other systems reviewed and are negative.      Objective:   Physical Exam Vitals reviewed.  Constitutional:      General: She is not in acute distress.    Appearance: She is well-developed.  HENT:     Head: Normocephalic and atraumatic.     Right Ear: Tympanic membrane normal.     Left Ear: Tympanic membrane normal.  Eyes:     Pupils: Pupils are equal, round, and reactive to light.  Neck:     Thyroid: No thyromegaly.  Cardiovascular:     Rate and Rhythm: Normal rate and regular rhythm.     Heart sounds: Normal heart sounds. No murmur heard. Pulmonary:     Effort: Pulmonary effort is normal. No respiratory distress.     Breath  sounds: Normal breath sounds. No wheezing.     Comments: Dry nonproductive cough Abdominal:     General: Bowel sounds are normal. There is no distension.     Palpations: Abdomen is soft.     Tenderness: There is no abdominal tenderness.  Musculoskeletal:        General: No tenderness. Normal range of motion.     Cervical back: Normal range of motion and neck supple.  Skin:    General: Skin is warm and dry.     Findings: No bruising.  Neurological:     Mental Status: She is alert and oriented to person, place, and time.     Cranial Nerves: No cranial nerve deficit.     Deep Tendon Reflexes: Reflexes are normal and symmetric.  Psychiatric:        Behavior: Behavior normal.        Thought Content: Thought content normal.        Judgment: Judgment normal.    BP 101/62   Pulse 79   Temp 98.7 F (37.1 C) (Oral)   Ht 5\' 2"  (1.575 m)   Wt 124 lb 6.4 oz (56.4 kg)   SpO2 96%   BMI 22.75 kg/m      Assessment & Plan:  Keviana Guida Bazile comes in today with chief complaint of Sore Throat, Cough, Headache (BODY ), and  Generalized Body Aches (EXPOSED TO THE FLU)   Diagnosis and orders addressed:  1. Fever, unspecified fever cause (Primary) - Veritor Flu A/B Waived  2. Flu-like symptoms - oseltamivir (TAMIFLU) 75 MG capsule; Take 1 capsule (75 mg total) by mouth 2 (two) times daily.  Dispense: 10 capsule; Refill: 0  3. Exposure to the flu - oseltamivir (TAMIFLU) 75 MG capsule; Take 1 capsule (75 mg total) by mouth 2 (two) times daily.  Dispense: 10 capsule; Refill: 0  Force Fluids  Droplet precautions  Tylenol as needed  Tamiflu BID  Follow up if symptoms worsen or do not improve   Jannifer Rodney, FNP

## 2023-10-07 ENCOUNTER — Ambulatory Visit: Payer: Self-pay | Admitting: Nurse Practitioner

## 2023-10-07 ENCOUNTER — Ambulatory Visit
Admission: EM | Admit: 2023-10-07 | Discharge: 2023-10-07 | Disposition: A | Discharge status: Home / Self Care | Payer: BC Managed Care – PPO | Attending: Family Medicine | Admitting: Family Medicine

## 2023-10-07 DIAGNOSIS — R52 Pain, unspecified: Secondary | ICD-10-CM

## 2023-10-07 DIAGNOSIS — R509 Fever, unspecified: Secondary | ICD-10-CM | POA: Diagnosis not present

## 2023-10-07 DIAGNOSIS — R5383 Other fatigue: Secondary | ICD-10-CM | POA: Diagnosis not present

## 2023-10-07 LAB — POC COVID19/FLU A&B COMBO
Covid Antigen, POC: NEGATIVE
Influenza A Antigen, POC: NEGATIVE
Influenza B Antigen, POC: NEGATIVE

## 2023-10-07 NOTE — ED Triage Notes (Signed)
 Pt states she has the flu finished taking tamiflu  today but states she is still not feeling well.

## 2023-10-07 NOTE — Discharge Instructions (Signed)
 Your vital signs, exam are very reassuring today and your COVID and flu testing is negative.  I suspect a viral illness to be causing your symptoms.  Follow-up for significantly worsening symptoms and take over-the-counter fever reducers, drink plenty of fluids

## 2023-10-07 NOTE — Telephone Encounter (Signed)
 Chief Complaint: body aches Symptoms: body aches, fever Frequency: started feeling poorly on Sunday 2/2, diagnosed with flu on 2/3 on the office Pertinent Negatives: Patient denies N/VD, abd pain, sore throat, difficulty breathing Disposition: [x] ED /[] Urgent Care (no appt availability in office) / [] Appointment(In office/virtual)/ []  [place] [facility]/ [] Home Care/ [] Refused Recommended Disposition /[] [place] [facility]/ []  Follow-up with PCP Additional Notes: Pt states she was diagnosed with flu on 2/3 in the office and prescribed Tamiflu . Pt states she started feeling much better. Pt states she felt great yesterday and most of today. Then, in the last 2 hrs, pt reports feeling poorly again. States she felt feverish and has a temperature of 99.84F. States she has 7/10 body aches. No other symptoms. Pt frustrated she is feeling badly again. Hx of Type 1 DM. Per protocol, RN advised pt she should be seen again within the next 24 hrs. Being that it is Friday, RN advised UC. Pt agreeable. UC in  is closest. RN reviewed wait times with pt. Pt states she will go there. Pt advised to call back for any worsening in the meantime, pt verbalized understanding.   Copied from CRM [id number]. Topic: Clinical - Medical Advice >> Oct 07, 2023  4:34 PM Graeme ORN wrote: Reason for CRM: Patient called in stated she was seen Monday given Tamiflu , fever broke Tuesday and she started feeling better until today and she is getting a fever and body aches and fatigue again. Wanted to let provider know before the weekend. Is there anything else provider can suggest? Reason for Disposition  [1] Fever returns after gone for over 24 hours AND [2] symptoms worse or not improved  Answer Assessment - Initial Assessment Questions 1. DIAGNOSIS CONFIRMATION: When was the influenza diagnosed? By whom? Did you get a test for it?     2/3 2. MEDICINES: Were you prescribed any medications for the influenza?   (e.g., zanamivir [Relenza], oseltamavir [Tamiflu ]).      Given Tamiflu , states she felt a lot better yesterday, now in the last two hours is feeling much worse 3. ONSET of SYMPTOMS: When did your symptoms start?     Last Sunday  4. SYMPTOMS: What symptoms are you most concerned about? (e.g., runny nose, stuffy nose, sore throat, cough, breathing difficulty, fever)     Body aches (7/10), fever 5. COUGH: How bad is the cough?     No cough 6. FEVER: Do you have a fever? If Yes, ask: What is your temperature, how was it measured, and when did it start?     Fever (99.84F in the ear and temporally), body aches 7. RESPIRATORY DISTRESS: Are you having any trouble breathing? If Yes, ask: Describe your breathing.      No 8. FLU VACCINE: Did you receive a flu shot this year? (e.g., seasonal influenza, H1N1)     I did not 10. HIGH RISK for COMPLICATIONS: Do you have any heart or lung problems? Do you have a weakened immune system? (e.g., CHF, COPD, asthma, HIV positive, chemotherapy, renal failure, diabetes mellitus, sickle cell anemia)       Type 1 DM - eating and drinking OK, no problem with sugars  Protocols used: Influenza (Flu) Follow-up Call-A-AH

## 2023-10-11 NOTE — ED Provider Notes (Signed)
 [facility]    CSN: [id number] Arrival date & time: 10/07/23  1736      History   Chief Complaint Chief Complaint  Patient presents with   Fever    HPI Summer Spencer is a 33 y.o. female.   Presenting today with about 1 week of ongoing flu-like symptoms. Diagnosed with suspected flu via virtual visit, given tamiflu  which she has completed but not feeling any better. Takes thought she was getting a bit better a few days ago but now 1 day of return of fever and worsening congestion, fatigue. Denies CP, SOB, abdominal pain, N/V/D. No known hx of pulmonary dz. Hx of DM1.     Past Medical History:  Diagnosis Date   Bronchitis    Diabetes mellitus without complication (HCC)    Hypoglycemia     Patient Active Problem List   Diagnosis Date Noted   DKA, type 1 (HCC) 09/21/2022   Type 1 diabetes mellitus with hyperglycemia (HCC) 03/01/2017   Type 1 diabetes mellitus (HCC) 05/18/2016    Past Surgical History:  Procedure Laterality Date   hymen     WISDOM TOOTH EXTRACTION      OB History     Gravida  2   Para  2   Term  2   Preterm  0   AB  0   Living  2      SAB  0   IAB  0   Ectopic  0   Multiple  0   Live Births  1            Home Medications    Prior to Admission medications   Medication Sig Start Date End Date Taking? Authorizing Provider  BAQSIMI ONE PACK 3 MG/DOSE POWD Place 1 spray into the nose as needed (hypoglycemia). 07/09/22   [provider]  Blood Glucose Monitoring Suppl (FIFTY50 GLUCOSE METER 2.0) w/Device KIT 1 each by Other route as directed. Uses when not using Dexcom 03/03/17   [provider]  Continuous Blood Gluc Receiver (DEXCOM G6 RECEIVER) DEVI 1 each by Other route as directed. Use as directed for continuous glucose monitoring. 09/07/18   [provider]  Continuous Blood Gluc Sensor (DEXCOM G6 SENSOR) MISC 1 each by Other route as directed. Use as directed for 10 days for continuous  glucose monitoring. 10/25/19   [provider]  Continuous Blood Gluc Transmit (DEXCOM G6 TRANSMITTER) MISC 1 each by Other route as directed. Use as directed for continuous glucose monitoring. Replace every 90 days. 10/25/19   [provider]  diphenhydrAMINE  (BENADRYL ) 25 mg capsule Take 25 mg by mouth as needed for allergies or itching.    [provider]  glucose blood (CONTOUR NEXT TEST) test strip 1 each by Other route 4 (four) times daily. Uses when not using Dexcom 07/12/19   [provider]  Insulin  Disposable Pump (OMNIPOD 5 G6 POD, GEN 5,) MISC Inject 1 each into the skin every 3 (three) days. 08/05/22   [provider]  metoCLOPramide  (REGLAN ) 5 MG tablet Take 1 tablet (5 mg total) by mouth 4 (four) times daily. 10/04/22   Gladis Mary-Margaret, FNP  NOVOLOG  100 UNIT/ML injection SMARTSIG:0-30 Unit(s) SUB-Q Daily 05/20/23   [provider]  oseltamivir  (TAMIFLU ) 75 MG capsule Take 1 capsule (75 mg total) by mouth 2 (two) times daily. 10/03/23   Hawks, Christy A, FNP  TRESIBA FLEXTOUCH 100 UNIT/ML FlexTouch Pen SMARTSIG:15 Unit(s) SUB-Q Daily    [provider]    Family History Family History  Problem Relation Age of Onset   Hypertension Father    Diabetes Maternal Grandmother    Liver disease Maternal Grandfather    Hypertension Paternal Grandmother    Hypertension Other    Hyperlipidemia Other    Anesthesia problems Neg Hx     Social History Social History   Tobacco Use   Smoking status: Former    Current packs/day: 0.00    Average packs/day: 0.3 packs/day for 2.0 years (0.5 ttl pk-yrs)    Types: Cigarettes    Start date: 12/29/2014    Quit date: 12/28/2016    Years since quitting: 6.7   Smokeless tobacco: Never  Vaping Use   Vaping status: Never Used  Substance Use Topics   Alcohol use: Yes    Alcohol/week: 1.0 standard drink of alcohol    Types: 1 Standard drinks or equivalent per week   Drug use: No      Allergies   Metronidazole , Septra [bactrim], Sulfamethoxazole, and Sulfamethoxazole-trimethoprim   Review of Systems Review of Systems PER HPI  Physical Exam Triage Vital Signs ED Triage Vitals  Encounter Vitals Group     BP 10/07/23 1804 127/80     Systolic BP Percentile --      Diastolic BP Percentile --      Pulse Rate 10/07/23 1804 79     Resp 10/07/23 1804 16     Temp 10/07/23 1804 98.8 F (37.1 C)     Temp Source 10/07/23 1804 Oral     SpO2 10/07/23 1804 98 %     Weight --      Height --      Head Circumference --      Peak Flow --      Pain Score 10/07/23 1805 0     Pain Loc --      Pain Education --      Exclude from Growth Chart --    No data found.  Updated Vital Signs BP 127/80 (BP Location: Right Arm)   Pulse 79   Temp 98.8 F (37.1 C) (Oral)   Resp 16   LMP 09/19/2023 (Approximate)   SpO2 98%   Visual Acuity Right Eye Distance:   Left Eye Distance:   Bilateral Distance:    Right Eye Near:   Left Eye Near:    Bilateral Near:     Physical Exam Vitals and nursing note reviewed.  Constitutional:      Appearance: Normal appearance.  HENT:     Head: Atraumatic.     Right Ear: Tympanic membrane and external ear normal.     Left Ear: Tympanic membrane and external ear normal.     Nose: Rhinorrhea present.     Mouth/Throat:     Mouth: Mucous membranes are moist.     Pharynx: Posterior oropharyngeal erythema present.  Eyes:     Extraocular Movements: Extraocular movements intact.     Conjunctiva/sclera: Conjunctivae normal.  Cardiovascular:     Rate and Rhythm: Normal rate and regular rhythm.     Heart sounds: Normal heart sounds.  Pulmonary:     Effort: Pulmonary effort is normal.     Breath sounds: Normal breath sounds. No wheezing.  Musculoskeletal:        General: Normal range of motion.     Cervical back: Normal range of motion and neck supple.  Skin:    General: Skin is warm and dry.  Neurological:     Mental  Status: She is  alert and oriented to person, place, and time.  Psychiatric:        Mood and Affect: Mood normal.        Thought Content: Thought content normal.      UC Treatments / Results  Labs (all labs ordered are listed, but only abnormal results are displayed) Labs Reviewed  POC COVID19/FLU A&B COMBO    EKG   Radiology No results found.  Procedures Procedures (including critical care time)  Medications Ordered in UC Medications - No data to display  Initial Impression / Assessment and Plan / UC Course  I have reviewed the triage vital signs and the nursing notes.  Pertinent labs & imaging results that were available during my care of the patient were reviewed by me and considered in my medical decision making (see chart for details).     Rapid flu and Covid neg, vitals and exam reassuring. Suspect ongoing viral sxs - discussed supportive OTC medications, home care and return precautions.  Final Clinical Impressions(s) / UC Diagnoses   Final diagnoses:  Fever, unspecified  Other fatigue  Generalized body aches     Discharge Instructions      Your vital signs, exam are very reassuring today and your COVID and flu testing is negative.  I suspect a viral illness to be causing your symptoms.  Follow-up for significantly worsening symptoms and take over-the-counter fever reducers, drink plenty of fluids    ED Prescriptions   None    PDMP not reviewed this encounter.   Stuart Vernell Norris, [place] 10/11/23 2156

## 2023-11-10 DIAGNOSIS — Z9641 Presence of insulin pump (external) (internal): Secondary | ICD-10-CM | POA: Diagnosis not present

## 2023-11-10 DIAGNOSIS — E10649 Type 1 diabetes mellitus with hypoglycemia without coma: Secondary | ICD-10-CM | POA: Diagnosis not present

## 2023-11-10 DIAGNOSIS — Z978 Presence of other specified devices: Secondary | ICD-10-CM | POA: Diagnosis not present

## 2023-11-15 DIAGNOSIS — E1065 Type 1 diabetes mellitus with hyperglycemia: Secondary | ICD-10-CM | POA: Diagnosis not present

## 2023-11-18 DIAGNOSIS — E1065 Type 1 diabetes mellitus with hyperglycemia: Secondary | ICD-10-CM | POA: Diagnosis not present

## 2023-11-28 ENCOUNTER — Ambulatory Visit (INDEPENDENT_AMBULATORY_CARE_PROVIDER_SITE_OTHER): Admitting: Family

## 2023-11-28 ENCOUNTER — Ambulatory Visit: Admitting: Family

## 2023-11-28 ENCOUNTER — Encounter: Payer: Self-pay | Admitting: Family

## 2023-11-28 VITALS — BP 117/81 | HR 75 | Temp 97.8°F

## 2023-11-28 DIAGNOSIS — E109 Type 1 diabetes mellitus without complications: Secondary | ICD-10-CM | POA: Diagnosis not present

## 2023-11-28 DIAGNOSIS — B370 Candidal stomatitis: Secondary | ICD-10-CM

## 2023-11-28 MED ORDER — NYSTATIN 100000 UNIT/ML MT SUSP: 5.0000 mL | Freq: Four times a day (QID) | OROMUCOSAL | 1 refills | Status: DC

## 2023-11-28 NOTE — Patient Instructions (Signed)

## 2023-11-28 NOTE — Progress Notes (Signed)
 Subjective:    Patient ID: Summer Spencer, female    DOB: December 14, 1990, 33 y.o.   MRN: [id number]  Chief Complaint  Patient presents with   Summer Spencer   HPI  PT presents to the office today with complaints tongue pain and sore throat that started last week. She has used salt water and apple cider vingear without relief.   She is a Type 1 Diabetic.    Review of Systems  HENT:  Positive for sore throat.   All other systems reviewed and are negative.   Social History   Socioeconomic History   Marital status: Married    Spouse name: Not on file   Number of children: Not on file   Years of education: Not on file   Highest education level: Some college, no degree  Occupational History   Not on file  Tobacco Use   Smoking status: Former    Current packs/day: 0.00    Average packs/day: 0.3 packs/day for 2.0 years (0.5 ttl pk-yrs)    Types: Cigarettes    Start date: 12/29/2014    Quit date: 12/28/2016    Years since quitting: 6.9   Smokeless tobacco: Never  Vaping Use   Vaping status: Never Used  Substance and Sexual Activity   Alcohol use: Yes    Alcohol/week: 1.0 standard drink of alcohol    Types: 1 Standard drinks or equivalent per week   Drug use: No   Sexual activity: Yes  Other Topics Concern   Not on file  Social History Narrative   Not on file   Social Drivers of Health   Financial Resource Strain: Low Risk  (10/03/2023)   Overall Financial Resource Strain (CARDIA)    Difficulty of Paying Living Expenses: Not hard at all  Food Insecurity: No Food Insecurity (10/03/2023)   Hunger Vital Sign    Worried About Running Out of Food in the Last Year: Never true    Ran Out of Food in the Last Year: Never true  Transportation Needs: No Transportation Needs (10/03/2023)   PRAPARE - Administrator, Civil Service (Medical): No    Lack of Transportation (Non-Medical): No  Physical Activity: Sufficiently Active (10/03/2023)   Exercise Vital Sign    Days of Exercise  per Week: 5 days    Minutes of Exercise per Session: 30 min  Recent Concern: Physical Activity - Insufficiently Active (07/06/2023)   Exercise Vital Sign    Days of Exercise per Week: 3 days    Minutes of Exercise per Session: 30 min  Stress: No Stress Concern Present (10/03/2023)   [employer] of Occupational Health - Occupational Stress Questionnaire    Feeling of Stress : Not at all  Social Connections: Socially Integrated (10/03/2023)   Social Connection and Isolation Panel [NHANES]    Frequency of Communication with Friends and Family: More than three times a week    Frequency of Social Gatherings with Friends and Family: Three times a week    Attends Religious Services: More than 4 times per year    Active Member of Clubs or Organizations: Yes    Attends Engineer, structural: More than 4 times per year    Marital Status: Married   Family History  Problem Relation Age of Onset   Hypertension Father    Diabetes Maternal Grandmother    Liver disease Maternal Grandfather    Hypertension Paternal Grandmother    Hypertension Other    Hyperlipidemia Other  Anesthesia problems Neg Hx         Objective:   Physical Exam Vitals reviewed.  Constitutional:      General: She is not in acute distress.    Appearance: She is well-developed.  HENT:     Head: Normocephalic and atraumatic.     Right Ear: Tympanic membrane normal.     Left Ear: Tympanic membrane normal.     Mouth/Throat:     Comments: White coating on tongue, right tonsil, and bilateral cheeks Eyes:     Pupils: Pupils are equal, round, and reactive to light.  Neck:     Thyroid: No thyromegaly.  Cardiovascular:     Rate and Rhythm: Normal rate and regular rhythm.     Heart sounds: Normal heart sounds. No murmur heard. Pulmonary:     Effort: Pulmonary effort is normal. No respiratory distress.     Breath sounds: Normal breath sounds. No wheezing.  Abdominal:     General: Bowel sounds are normal. There  is no distension.     Palpations: Abdomen is soft.     Tenderness: There is no abdominal tenderness.  Musculoskeletal:        General: No tenderness. Normal range of motion.     Cervical back: Normal range of motion and neck supple.  Skin:    General: Skin is warm and dry.  Neurological:     Mental Status: She is alert and oriented to person, place, and time.     Cranial Nerves: No cranial nerve deficit.     Deep Tendon Reflexes: Reflexes are normal and symmetric.  Psychiatric:        Behavior: Behavior normal.        Thought Content: Thought content normal.        Judgment: Judgment normal.       BP 117/81   Pulse 75   Temp 97.8 F (36.6 C) (Temporal)      Assessment & Plan:  Summer Spencer comes in today with chief complaint of Thrush   Diagnosis and orders addressed:  1. Oral thrush (Primary) Start nystatin swish and swallow Rinse water after meals Need to have good control of glucose - nystatin (MYCOSTATIN) 100000 UNIT/ML suspension; Take 5 mLs (500,000 Units total) by mouth 4 (four) times daily.  Dispense: 473 mL; Refill: 1  2. Type 1 diabetes mellitus without complication (HCC) Continue good control of glucose   Jannifer Rodney, FNP

## 2023-11-30 DIAGNOSIS — B370 Candidal stomatitis: Secondary | ICD-10-CM

## 2023-11-30 DIAGNOSIS — E1065 Type 1 diabetes mellitus with hyperglycemia: Secondary | ICD-10-CM | POA: Diagnosis not present

## 2023-12-01 MED ORDER — FLUCONAZOLE 150 MG PO TABS
150.0000 mg | ORAL_TABLET | ORAL | 0 refills | Status: DC | PRN
Start: 2023-12-01 — End: 2023-12-27

## 2023-12-09 DIAGNOSIS — E1065 Type 1 diabetes mellitus with hyperglycemia: Secondary | ICD-10-CM | POA: Diagnosis not present

## 2023-12-09 MED ORDER — NYSTATIN 100000 UNIT/ML MT SUSP: 5.0000 mL | Freq: Four times a day (QID) | OROMUCOSAL | 1 refills | Status: DC

## 2023-12-09 NOTE — Addendum Note (Signed)
 Addended by: Bennie Pierini on: 12/09/2023 08:37 AM   Modules accepted: Orders

## 2023-12-19 DIAGNOSIS — E1065 Type 1 diabetes mellitus with hyperglycemia: Secondary | ICD-10-CM | POA: Diagnosis not present

## 2023-12-27 ENCOUNTER — Ambulatory Visit (INDEPENDENT_AMBULATORY_CARE_PROVIDER_SITE_OTHER): Admitting: Nurse Practitioner

## 2023-12-27 ENCOUNTER — Encounter: Payer: Self-pay | Admitting: Nurse Practitioner

## 2023-12-27 VITALS — BP 110/68 | HR 66 | Temp 98.9°F | Ht 62.0 in | Wt 124.0 lb

## 2023-12-27 DIAGNOSIS — J029 Acute pharyngitis, unspecified: Secondary | ICD-10-CM

## 2023-12-27 MED ORDER — AMOXICILLIN 875 MG PO TABS: 875.0000 mg | ORAL_TABLET | Freq: Two times a day (BID) | ORAL | 0 refills | Status: DC

## 2023-12-27 NOTE — Progress Notes (Signed)
 Subjective:    Patient ID: Summer Spencer, female    DOB: 12/24/1990, 33 y.o.   MRN: [id number]   Chief Complaint: Sore Throat (Taking diflucan  and nystatin  swish for weeks and no better)   Sore Throat  Pertinent negatives include no abdominal pain, headaches or shortness of breath.    Patient in today c/o sore throat. She has had for several months. Has been taking diflucan  and nystatin  and is no better. She is insulin  dependent diabetic and gets thrush often.  Patient Active Problem List   Diagnosis Date Noted   DKA, type 1 (HCC) 09/21/2022   Type 1 diabetes mellitus with hyperglycemia (HCC) 03/01/2017   Type 1 diabetes mellitus (HCC) 05/18/2016       Review of Systems  Constitutional:  Negative for diaphoresis.  Eyes:  Negative for pain.  Respiratory:  Negative for shortness of breath.   Cardiovascular:  Negative for chest pain, palpitations and leg swelling.  Gastrointestinal:  Negative for abdominal pain.  Endocrine: Negative for polydipsia.  Skin:  Negative for rash.  Neurological:  Negative for dizziness, weakness and headaches.  Hematological:  Does not bruise/bleed easily.  All other systems reviewed and are negative.      Objective:   Physical Exam Constitutional:      Appearance: Normal appearance.  HENT:     Mouth/Throat:     Mouth: Mucous membranes are dry.     Pharynx: Posterior oropharyngeal erythema present. No oropharyngeal exudate.  Cardiovascular:     Rate and Rhythm: Normal rate and regular rhythm.     Pulses: Normal pulses.     Heart sounds: Normal heart sounds.  Pulmonary:     Effort: Pulmonary effort is normal.     Breath sounds: Normal breath sounds.  Musculoskeletal:     Cervical back: Normal range of motion and neck supple. No tenderness.  Lymphadenopathy:     Cervical: No cervical adenopathy.  Skin:    General: Skin is warm.  Neurological:     General: No focal deficit present.     Mental Status: She is alert and oriented to  person, place, and time.  Psychiatric:        Mood and Affect: Mood normal.        Behavior: Behavior normal.    BP 110/68   Pulse 66   Temp 98.9 F (37.2 C) (Temporal)   Ht 5\' 2"  (1.575 m)   Wt 124 lb (56.2 kg)   SpO2 100%   BMI 22.68 kg/m         Assessment & Plan:   Summer Spencer in today with chief complaint of Sore Throat (Taking diflucan  and nystatin  swish for weeks and no better)   1. Pharyngitis, unspecified etiology (Primary) Force fluids Motrin  or tylenol  OTC OTC decongestant Throat lozenges if help New toothbrush in 3 days Meds ordered this encounter  Medications   amoxicillin  (AMOXIL ) 875 MG tablet    Sig: Take 1 tablet (875 mg total) by mouth 2 (two) times daily. 1 po BID    Dispense:  20 tablet    Refill:  0    Supervising Provider:   Jolyne Needs A [[id number]]       The above assessment and management plan was discussed with the patient. The patient verbalized understanding of and has agreed to the management plan. Patient is aware to call the clinic if symptoms persist or worsen. Patient is aware when to return to the clinic for a follow-up visit. Patient  educated on when it is appropriate to go to the emergency department.   Mary-Margaret Gaylyn Keas, FNP

## 2023-12-27 NOTE — Patient Instructions (Signed)

## 2023-12-31 DIAGNOSIS — J028 Acute pharyngitis due to other specified organisms: Secondary | ICD-10-CM | POA: Diagnosis not present

## 2023-12-31 DIAGNOSIS — Z1330 Encounter for screening examination for mental health and behavioral disorders, unspecified: Secondary | ICD-10-CM | POA: Diagnosis not present

## 2023-12-31 DIAGNOSIS — B9689 Other specified bacterial agents as the cause of diseases classified elsewhere: Secondary | ICD-10-CM | POA: Diagnosis not present

## 2024-01-09 DIAGNOSIS — J020 Streptococcal pharyngitis: Secondary | ICD-10-CM

## 2024-01-09 NOTE — Telephone Encounter (Signed)
 Referral to ENT- need to do daily allergy med like claritin, force fluids and motrin  or tylenol  as needed.  Mary-Margaret Gaylyn Keas, FNP

## 2024-01-12 ENCOUNTER — Ambulatory Visit: Payer: BC Managed Care – PPO | Admitting: Nurse Practitioner

## 2024-01-12 ENCOUNTER — Encounter: Payer: Self-pay | Admitting: Nurse Practitioner

## 2024-01-12 VITALS — BP 108/67 | HR 74 | Temp 97.9°F | Ht 62.0 in | Wt 125.0 lb

## 2024-01-12 DIAGNOSIS — E109 Type 1 diabetes mellitus without complications: Secondary | ICD-10-CM | POA: Diagnosis not present

## 2024-01-12 DIAGNOSIS — Z0001 Encounter for general adult medical examination with abnormal findings: Secondary | ICD-10-CM

## 2024-01-12 DIAGNOSIS — Z0000 Encounter for general adult medical examination without abnormal findings: Secondary | ICD-10-CM | POA: Diagnosis not present

## 2024-01-12 NOTE — Patient Instructions (Signed)
 Exercising to Stay Healthy To become healthy and stay healthy, it is recommended that you do moderate-intensity and vigorous-intensity exercise. You can tell that you are exercising at a moderate intensity if your heart starts beating faster and you start breathing faster but can still hold a conversation. You can tell that you are exercising at a vigorous intensity if you are breathing much harder and faster and cannot hold a conversation while exercising. How can exercise benefit me? Exercising regularly is important. It has many health benefits, such as: Improving overall fitness, flexibility, and endurance. Increasing bone density. Helping with weight control. Decreasing body fat. Increasing muscle strength and endurance. Reducing stress and tension, anxiety, depression, or anger. Improving overall health. What guidelines should I follow while exercising? Before you start a new exercise program, talk with your health care provider. Do not exercise so much that you hurt yourself, feel dizzy, or get very short of breath. Wear comfortable clothes and wear shoes with good support. Drink plenty of water while you exercise to prevent dehydration or heat stroke. Work out until your breathing and your heartbeat get faster (moderate intensity). How often should I exercise? Choose an activity that you enjoy, and set realistic goals. Your health care provider can help you make an activity plan that is individually designed and works best for you. Exercise regularly as told by your health care provider. This may include: Doing strength training two times a week, such as: Lifting weights. Using resistance bands. Push-ups. Sit-ups. Yoga. Doing a certain intensity of exercise for a given amount of time. Choose from these options: A total of 150 minutes of moderate-intensity exercise every week. A total of 75 minutes of vigorous-intensity exercise every week. A mix of moderate-intensity and  vigorous-intensity exercise every week. Children, pregnant women, people who have not exercised regularly, people who are overweight, and older adults may need to talk with a health care provider about what activities are safe to perform. If you have a medical condition, be sure to talk with your health care provider before you start a new exercise program. What are some exercise ideas? Moderate-intensity exercise ideas include: Walking 1 mile (1.6 km) in about 15 minutes. Biking. Hiking. Golfing. Dancing. Water aerobics. Vigorous-intensity exercise ideas include: Walking 4.5 miles (7.2 km) or more in about 1 hour. Jogging or running 5 miles (8 km) in about 1 hour. Biking 10 miles (16.1 km) or more in about 1 hour. Lap swimming. Roller-skating or in-line skating. Cross-country skiing. Vigorous competitive sports, such as football, basketball, and soccer. Jumping rope. Aerobic dancing. What are some everyday activities that can help me get exercise? Yard work, such as: Child psychotherapist. Raking and bagging leaves. Washing your car. Pushing a stroller. Shoveling snow. Gardening. Washing windows or floors. How can I be more active in my day-to-day activities? Use stairs instead of an elevator. Take a walk during your lunch break. If you drive, park your car farther away from your work or school. If you take public transportation, get off one stop early and walk the rest of the way. Stand up or walk around during all of your indoor phone calls. Get up, stretch, and walk around every 30 minutes throughout the day. Enjoy exercise with a friend. Support to continue exercising will help you keep a regular routine of activity. Where to find more information You can find more information about exercising to stay healthy from: U.[place]. Department of Health and Human Services: ThisPath.fi Centers for Disease Control and Prevention (  CDC): FootballExhibition.com.br Summary Exercising regularly is  important. It will improve your overall fitness, flexibility, and endurance. Regular exercise will also improve your overall health. It can help you control your weight, reduce stress, and improve your bone density. Do not exercise so much that you hurt yourself, feel dizzy, or get very short of breath. Before you start a new exercise program, talk with your health care provider. This information is not intended to replace advice given to you by your health care provider. Make sure you discuss any questions you have with your health care provider. Document Revised: 12/12/2020 Document Reviewed: 12/12/2020 Elsevier Patient Education  2024 [employer].

## 2024-01-12 NOTE — Progress Notes (Signed)
 Subjective:    Patient ID: Summer Spencer, female    DOB: January 28, 1991, 33 y.o.   MRN: [id number]   Chief Complaint: No chief complaint on file.    HPI:  Summer Spencer is a 33 y.o. who identifies as a female who was assigned female at birth.   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 Annual physical exam No PAP today- last pap was done 05/09/23 and was normal  2. Type 1 diabetes mellitus without complication [facility]) See endocrinology. Has insulin  pump. Keeps blood sugars under good control. Hgba1c 7.8%    New complaints: None today  Allergies  Allergen Reactions   Metronidazole  Nausea And Vomiting and Rash   Septra [Bactrim] Rash   Sulfamethoxazole Rash   Sulfamethoxazole-Trimethoprim Rash   Outpatient Encounter Medications as of 01/12/2024  Medication Sig   amoxicillin  (AMOXIL ) 875 MG tablet Take 1 tablet (875 mg total) by mouth 2 (two) times daily. 1 po BID   BAQSIMI ONE PACK 3 MG/DOSE POWD Place 1 spray into the nose as needed (hypoglycemia).   Blood Glucose Monitoring Suppl (FIFTY50 GLUCOSE METER 2.0) w/Device KIT 1 each by Other route as directed. Uses when not using Dexcom   Continuous Blood Gluc Receiver (DEXCOM G6 RECEIVER) DEVI 1 each by Other route as directed. Use as directed for continuous glucose monitoring.   Continuous Blood Gluc Sensor (DEXCOM G6 SENSOR) MISC 1 each by Other route as directed. Use as directed for 10 days for continuous glucose monitoring.   Continuous Blood Gluc Transmit (DEXCOM G6 TRANSMITTER) MISC 1 each by Other route as directed. Use as directed for continuous glucose monitoring. Replace every 90 days.   diphenhydrAMINE  (BENADRYL ) 25 mg capsule Take 25 mg by mouth as needed for allergies or itching.   glucose blood (CONTOUR NEXT TEST) test strip 1 each by Other route 4 (four) times daily. Uses when not using Dexcom   Insulin   Disposable Pump (OMNIPOD 5 G6 POD, GEN 5,) MISC Inject 1 each into the skin every 3 (three) days.   metoCLOPramide  (REGLAN ) 5 MG tablet Take 1 tablet (5 mg total) by mouth 4 (four) times daily.   NOVOLOG  100 UNIT/ML injection SMARTSIG:0-30 Unit(s) SUB-Q Daily   TRESIBA FLEXTOUCH 100 UNIT/ML FlexTouch Pen SMARTSIG:15 Unit(s) SUB-Q Daily   No facility-administered encounter medications on file as of 01/12/2024.    Past Surgical History:  Procedure Laterality Date   hymen     WISDOM TOOTH EXTRACTION      Family History  Problem Relation Age of Onset   Hypertension Father    Diabetes Maternal Grandmother    Liver disease Maternal Grandfather    Hypertension Paternal Grandmother    Hypertension Other    Hyperlipidemia Other    Anesthesia problems Neg Hx       Controlled substance contract: n/a     Review of Systems  Constitutional:  Negative for diaphoresis.  Eyes:  Negative for pain.  Respiratory:  Negative for shortness of breath.   Cardiovascular:  Negative for chest pain, palpitations and leg swelling.  Gastrointestinal:  Negative for abdominal pain.  Endocrine: Negative for polydipsia.  Skin:  Negative for rash.  Neurological:  Negative for dizziness, weakness and headaches.  Hematological:  Does not bruise/bleed easily.  All other systems reviewed and are negative.      Objective:   Physical Exam Vitals and nursing  note reviewed.  Constitutional:      General: She is not in acute distress.    Appearance: Normal appearance. She is well-developed.  HENT:     Head: Normocephalic.     Right Ear: Tympanic membrane normal.     Left Ear: Tympanic membrane normal.     Nose: Nose normal.     Mouth/Throat:     Mouth: Mucous membranes are moist.  Eyes:     Pupils: Pupils are equal, round, and reactive to light.  Neck:     Vascular: No carotid bruit or JVD.  Cardiovascular:     Rate and Rhythm: Normal rate and regular rhythm.     Heart sounds: Normal heart sounds.   Pulmonary:     Effort: Pulmonary effort is normal. No respiratory distress.     Breath sounds: Normal breath sounds. No wheezing or rales.  Chest:     Chest wall: No tenderness.  Abdominal:     General: Bowel sounds are normal. There is no distension or abdominal bruit.     Palpations: Abdomen is soft. There is no hepatomegaly, splenomegaly, mass or pulsatile mass.     Tenderness: There is no abdominal tenderness.  Musculoskeletal:        General: Normal range of motion.     Cervical back: Normal range of motion and neck supple.  Lymphadenopathy:     Cervical: No cervical adenopathy.  Skin:    General: Skin is warm and dry.  Neurological:     Mental Status: She is alert and oriented to person, place, and time.     Deep Tendon Reflexes: Reflexes are normal and symmetric.  Psychiatric:        Behavior: Behavior normal.        Thought Content: Thought content normal.        Judgment: Judgment normal.     BP 108/67   Pulse 74   Temp 97.9 F (36.6 C) (Temporal)   Ht 5\' 2"  (1.575 m)   Wt 125 lb (56.7 kg)   SpO2 98%   BMI 22.86 kg/m        Assessment & Plan:   Summer Spencer in today with chief complaint of Annual Exam (No pap/)   1. Annual physical exam (Primary) Labs pending - CBC with Differential/Platelet - Vitamin B12 - VITAMIN D 25 Hydroxy (Vit-D Deficiency, Fractures)  2. Type 1 diabetes mellitus without complication (HCC) Keep follow up with endocrinology    The above assessment and management plan was discussed with the patient. The patient verbalized understanding of and has agreed to the management plan. Patient is aware to call the clinic if symptoms persist or worsen. Patient is aware when to return to the clinic for a follow-up visit. Patient educated on when it is appropriate to go to the emergency department.   Mary-Margaret Gaylyn Keas, FNP

## 2024-01-13 ENCOUNTER — Ambulatory Visit: Payer: Self-pay | Admitting: Nurse Practitioner

## 2024-01-13 LAB — CBC WITH DIFFERENTIAL/PLATELET
Basophils Absolute: 0.1 10*3/uL (ref 0.0–0.2)
Basos: 1 %
EOS (ABSOLUTE): 0 10*3/uL (ref 0.0–0.4)
Eos: 1 %
Hematocrit: 40 % (ref 34.0–46.6)
Hemoglobin: 13.6 g/dL (ref 11.1–15.9)
Immature Grans (Abs): 0 10*3/uL (ref 0.0–0.1)
Immature Granulocytes: 0 %
Lymphocytes Absolute: 1.9 10*3/uL (ref 0.7–3.1)
Lymphs: 30 %
MCH: 32.9 pg (ref 26.6–33.0)
MCHC: 34 g/dL (ref 31.5–35.7)
MCV: 97 fL (ref 79–97)
Monocytes Absolute: 0.5 10*3/uL (ref 0.1–0.9)
Monocytes: 8 %
Neutrophils Absolute: 3.8 10*3/uL (ref 1.4–7.0)
Neutrophils: 60 %
Platelets: 196 10*3/uL (ref 150–450)
RBC: 4.13 x10E6/uL (ref 3.77–5.28)
RDW: 11.8 % (ref 11.7–15.4)
WBC: 6.2 10*3/uL (ref 3.4–10.8)

## 2024-01-13 LAB — VITAMIN B12: Vitamin B-12: 590 pg/mL (ref 232–1245)

## 2024-01-13 LAB — VITAMIN D 25 HYDROXY (VIT D DEFICIENCY, FRACTURES): Vit D, 25-Hydroxy: 32.8 ng/mL (ref 30.0–100.0)

## 2024-01-18 DIAGNOSIS — E1065 Type 1 diabetes mellitus with hyperglycemia: Secondary | ICD-10-CM | POA: Diagnosis not present

## 2024-02-09 DIAGNOSIS — N760 Acute vaginitis: Secondary | ICD-10-CM | POA: Diagnosis not present

## 2024-02-18 DIAGNOSIS — E1065 Type 1 diabetes mellitus with hyperglycemia: Secondary | ICD-10-CM | POA: Diagnosis not present

## 2024-03-01 ENCOUNTER — Ambulatory Visit (INDEPENDENT_AMBULATORY_CARE_PROVIDER_SITE_OTHER): Admitting: Nurse Practitioner

## 2024-03-01 ENCOUNTER — Encounter: Payer: Self-pay | Admitting: Nurse Practitioner

## 2024-03-01 VITALS — BP 101/63 | HR 64 | Temp 98.0°F | Ht 62.0 in | Wt 123.0 lb

## 2024-03-01 DIAGNOSIS — J00 Acute nasopharyngitis [common cold]: Secondary | ICD-10-CM | POA: Diagnosis not present

## 2024-03-01 DIAGNOSIS — J029 Acute pharyngitis, unspecified: Secondary | ICD-10-CM

## 2024-03-01 NOTE — Patient Instructions (Signed)

## 2024-03-01 NOTE — Progress Notes (Signed)
   Subjective:    Patient ID: Summer Spencer, female    DOB: [birth year], 33 y.o.   MRN: [id number]   Chief Complaint: body aches and congestion  Sore Throat  Associated symptoms include congestion.    Patient comes in today c/o body aches and sore throat. This started on 2 days ago. Fever 99 and above.  Patient Active Problem List   Diagnosis Date Noted   Type 1 diabetes mellitus (HCC) 05/18/2016       Review of Systems  Constitutional:  Positive for chills and fever.  HENT:  Positive for congestion and rhinorrhea.        Objective:   Physical Exam Constitutional:      Appearance: Normal appearance.  Cardiovascular:     Rate and Rhythm: Normal rate and regular rhythm.     Heart sounds: Normal heart sounds.  Pulmonary:     Effort: Pulmonary effort is normal.     Breath sounds: Normal breath sounds.  Neurological:     General: No focal deficit present.     Mental Status: She is alert and oriented to person, place, and time.  Psychiatric:        Mood and Affect: Mood normal.        Behavior: Behavior normal.    BP 101/63   Pulse 64   Temp 98 F (36.7 C) (Temporal)   Ht 5' 2 (1.575 m)   Wt 123 lb (55.8 kg)   SpO2 98%   BMI 22.50 kg/m   Strep negative      Assessment & Plan:   Summer Spencer in today with chief complaint of Generalized Body Aches and Sore Throat   1. Sore throat (Primary) - Rapid Strep Screen ([facility] ONLY)  2. Acute nasopharyngitis 1. Take meds as prescribed 2. Use a cool mist humidifier especially during the winter months and when heat has been humid. 3. Use saline nose sprays frequently 4. Saline irrigations of the nose can be very helpful if done frequently.  * 4X daily for 1 week*  * Use of a nettie pot can be helpful with this. Follow directions with this* 5. Drink plenty of fluids 6. Keep thermostat turn down low 7.For any cough or congestion- mucinex  if needed 8. For fever or aces or pains- take tylenol  or  ibuprofen  appropriate for age and weight.  * for fevers greater than 101 orally you may alternate ibuprofen  and tylenol  every  3 hours.       The above assessment and management plan was discussed with the patient. The patient verbalized understanding of and has agreed to the management plan. Patient is aware to call the clinic if symptoms persist or worsen. Patient is aware when to return to the clinic for a follow-up visit. Patient educated on when it is appropriate to go to the emergency department.   Mary-Margaret Gladis, FNP

## 2024-03-05 LAB — RAPID STREP SCREEN (MED CTR MEBANE ONLY): Strep Gp A Ag, IA W/Reflex: NEGATIVE

## 2024-03-05 LAB — CULTURE, GROUP A STREP

## 2024-03-06 ENCOUNTER — Ambulatory Visit: Payer: Self-pay | Admitting: Nurse Practitioner

## 2024-03-14 DIAGNOSIS — E10649 Type 1 diabetes mellitus with hypoglycemia without coma: Secondary | ICD-10-CM | POA: Diagnosis not present

## 2024-03-14 DIAGNOSIS — Z978 Presence of other specified devices: Secondary | ICD-10-CM | POA: Diagnosis not present

## 2024-03-14 DIAGNOSIS — Z9641 Presence of insulin pump (external) (internal): Secondary | ICD-10-CM | POA: Diagnosis not present

## 2024-03-15 DIAGNOSIS — E1065 Type 1 diabetes mellitus with hyperglycemia: Secondary | ICD-10-CM | POA: Diagnosis not present

## 2024-03-19 DIAGNOSIS — E1065 Type 1 diabetes mellitus with hyperglycemia: Secondary | ICD-10-CM | POA: Diagnosis not present

## 2024-04-04 ENCOUNTER — Ambulatory Visit: Payer: Self-pay

## 2024-04-04 ENCOUNTER — Ambulatory Visit: Admitting: Family Medicine

## 2024-04-04 ENCOUNTER — Encounter: Payer: Self-pay | Admitting: Family Medicine

## 2024-04-04 VITALS — BP 111/67 | HR 80 | Temp 97.8°F | Wt 126.2 lb

## 2024-04-04 DIAGNOSIS — B9689 Other specified bacterial agents as the cause of diseases classified elsewhere: Secondary | ICD-10-CM

## 2024-04-04 DIAGNOSIS — N760 Acute vaginitis: Secondary | ICD-10-CM | POA: Diagnosis not present

## 2024-04-04 DIAGNOSIS — N898 Other specified noninflammatory disorders of vagina: Secondary | ICD-10-CM | POA: Diagnosis not present

## 2024-04-04 DIAGNOSIS — R300 Dysuria: Secondary | ICD-10-CM

## 2024-04-04 LAB — URINALYSIS, ROUTINE W REFLEX MICROSCOPIC
Bilirubin, UA: NEGATIVE
Ketones, UA: NEGATIVE
Nitrite, UA: NEGATIVE
RBC, UA: NEGATIVE
Specific Gravity, UA: >1.03 — ABNORMAL HIGH (ref 1.005–1.030)
Urobilinogen, Ur: 0.2 mg/dL (ref 0.2–1.0)
pH, UA: 5.5 (ref 5.0–7.5)

## 2024-04-04 LAB — MICROSCOPIC EXAMINATION
Renal Epithel, UA: NONE SEEN /HPF
Yeast, UA: NONE SEEN

## 2024-04-04 LAB — WET PREP FOR TRICH, YEAST, CLUE
Clue Cell Exam: NEGATIVE
Trichomonas Exam: NEGATIVE
Yeast Exam: NEGATIVE

## 2024-04-04 MED ORDER — FLUCONAZOLE 150 MG PO TABS: 150.0000 mg | ORAL_TABLET | Freq: Once | ORAL | 0 refills | Status: AC

## 2024-04-04 MED ORDER — CLINDAMYCIN PHOSPHATE 2 % VA CREA: 1.0000 | TOPICAL_CREAM | Freq: Every day | VAGINAL | 0 refills | Status: AC

## 2024-04-04 NOTE — Telephone Encounter (Signed)
 FYI Only or Action Required?: FYI only for provider.  Patient was last seen in primary care on 03/01/2024 by Summer Mustard, FNP.  Called Nurse Triage reporting Dysuria.  Symptoms began Monday.  Interventions attempted: Other: took Boric acid x 3.  Symptoms are: gradually worsening.  Triage Disposition: Call PCP Within 24 Hours  Patient/caregiver understands and will follow disposition?: Yes    Copied from CRM #[id number]. Topic: Clinical - Red Word Triage >> Apr 04, 2024  8:14 AM Donna BRAVO wrote: Red Word that prompted transfer to Nurse Triage: patient has itching and burning in vaginal starting Monday evening. Reason for Disposition  [1] Painful urination AND [2] EITHER frequency or urgency AND [3] has on-call doctor  Answer Assessment - Initial Assessment Questions 1. SEVERITY: How bad is the pain?  (e.g., Scale 1-10; mild, moderate, or severe)     5/10 2. FREQUENCY: How many times have you had painful urination today?      no 3. PATTERN: Is pain present every time you urinate or just sometimes?      sometimes 4. ONSET: When did the painful urination start?      Monday 5. FEVER: Do you have a fever? If Yes, ask: What is your temperature, how was it measured, and when did it start?     no 6. PAST UTI: Have you had a urine infection before? If Yes, ask: When was the last time? and What happened that time?      yes 7. CAUSE: What do you think is causing the painful urination?  (e.g., UTI, scratch, Herpes sore)     UTI 8. OTHER SYMPTOMS: Do you have any other symptoms? (e.g., blood in urine, flank pain, genital sores, urgency, vaginal discharge)     Itching, burning occurs sometimes with urination 9. PREGNANCY: Is there any chance you are pregnant? When was your last menstrual period?     na  Pt has used boric acid x 3 without relief.  Protocols used: Urination Pain - Female-A-AH

## 2024-04-04 NOTE — Progress Notes (Signed)
 Subjective: RR:ibdlmpj, vaginitis PCP: Gladis Mustard, FNP Summer Spencer is a 33 y.o. female presenting to clinic today for:  1. Dysuria, vaginitis She reports that symptoms started mildly in the last 48 hours where she just had some mild itching.  She notes that she consumed more sugar than normal over the weekend due to celebration.  She typically drinks a lot of water and symptoms of bacterial vaginosis will resolve but this time it did not and it got a bit worse.  She reports vaginal burning and itching.  Has scant discharge but thinks this may be related to the boric acid that she has used x 3 vaginally.  She does not have burning with urination or any discoloration to her urine.  She is sexually active with a monogamous partner.  No concerns for STIs. PMH: mildly uncontrolled DM1 (A1c 7.1). Patient's last menstrual period was 03/10/2024.    ROS: Per HPI  Allergies  Allergen Reactions   Metronidazole  Nausea And Vomiting and Rash   Septra [Bactrim] Rash   Sulfamethoxazole Rash   Sulfamethoxazole-Trimethoprim Rash   Past Medical History:  Diagnosis Date   Bronchitis    Diabetes mellitus without complication (HCC)    Hypoglycemia     Current Outpatient Medications:    amoxicillin  (AMOXIL ) 875 MG tablet, Take 1 tablet (875 mg total) by mouth 2 (two) times daily. 1 po BID, Disp: 20 tablet, Rfl: 0   BAQSIMI ONE PACK 3 MG/DOSE POWD, Place 1 spray into the nose as needed (hypoglycemia)., Disp: , Rfl:    Blood Glucose Monitoring Suppl (FIFTY50 GLUCOSE METER 2.0) w/Device KIT, 1 each by Other route as directed. Uses when not using Dexcom, Disp: , Rfl:    Continuous Blood Gluc Receiver (DEXCOM G6 RECEIVER) DEVI, 1 each by Other route as directed. Use as directed for continuous glucose monitoring., Disp: , Rfl:    Continuous Blood Gluc Sensor (DEXCOM G6 SENSOR) MISC, 1 each by Other route as directed. Use as directed for 10 days for continuous glucose monitoring., Disp: ,  Rfl:    Continuous Blood Gluc Transmit (DEXCOM G6 TRANSMITTER) MISC, 1 each by Other route as directed. Use as directed for continuous glucose monitoring. Replace every 90 days., Disp: , Rfl:    diphenhydrAMINE  (BENADRYL ) 25 mg capsule, Take 25 mg by mouth as needed for allergies or itching., Disp: , Rfl:    glucose blood (CONTOUR NEXT TEST) test strip, 1 each by Other route 4 (four) times daily. Uses when not using Dexcom, Disp: , Rfl:    Insulin  Disposable Pump (OMNIPOD 5 G6 POD, GEN 5,) MISC, Inject 1 each into the skin every 3 (three) days., Disp: , Rfl:    metoCLOPramide  (REGLAN ) 5 MG tablet, Take 1 tablet (5 mg total) by mouth 4 (four) times daily., Disp: 30 tablet, Rfl: 1   NOVOLOG  100 UNIT/ML injection, SMARTSIG:0-30 Unit(s) SUB-Q Daily, Disp: , Rfl:    TRESIBA FLEXTOUCH 100 UNIT/ML FlexTouch Pen, SMARTSIG:15 Unit(s) SUB-Q Daily, Disp: , Rfl:  Social History   Socioeconomic History   Marital status: Married    Spouse name: Not on file   Number of children: Not on file   Years of education: Not on file   Highest education level: Some college, no degree  Occupational History   Not on file  Tobacco Use   Smoking status: Former    Current packs/day: 0.00    Average packs/day: 0.3 packs/day for 2.0 years (0.5 ttl pk-yrs)    Types: Cigarettes  Start date: 12/29/2014    Quit date: 12/28/2016    Years since quitting: 7.2   Smokeless tobacco: Never  Vaping Use   Vaping status: Never Used  Substance and Sexual Activity   Alcohol use: Yes    Alcohol/week: 1.0 standard drink of alcohol    Types: 1 Standard drinks or equivalent per week   Drug use: No   Sexual activity: Yes  Other Topics Concern   Not on file  Social History Narrative   Not on file   Social Drivers of Health   Financial Resource Strain: Low Risk  (02/29/2024)   Overall Financial Resource Strain (CARDIA)    Difficulty of Paying Living Expenses: Not hard at all  Food Insecurity: No Food Insecurity (02/29/2024)    Hunger Vital Sign    Worried About Running Out of Food in the Last Year: Never true    Ran Out of Food in the Last Year: Never true  Transportation Needs: No Transportation Needs (02/29/2024)   PRAPARE - Administrator, Civil Service (Medical): No    Lack of Transportation (Non-Medical): No  Physical Activity: Insufficiently Active (02/29/2024)   Exercise Vital Sign    Days of Exercise per Week: 3 days    Minutes of Exercise per Session: 20 min  Stress: No Stress Concern Present (02/29/2024)   [employer] of Occupational Health - Occupational Stress Questionnaire    Feeling of Stress: Not at all  Social Connections: Socially Integrated (02/29/2024)   Social Connection and Isolation Panel    Frequency of Communication with Friends and Family: Three times a week    Frequency of Social Gatherings with Friends and Family: Once a week    Attends Religious Services: More than 4 times per year    Active Member of [employer] or Organizations: Yes    Attends Engineer, structural: More than 4 times per year    Marital Status: Married  Catering manager Violence: Not At Risk (09/23/2022)   Humiliation, Afraid, Rape, and Kick questionnaire    Fear of Current or Ex-Partner: No    Emotionally Abused: No    Physically Abused: No    Sexually Abused: No   Family History  Problem Relation Age of Onset   Hypertension Father    Diabetes Maternal Grandmother    Liver disease Maternal Grandfather    Hypertension Paternal Grandmother    Hypertension Other    Hyperlipidemia Other    Anesthesia problems Neg Hx     Objective: Office vital signs reviewed. BP 111/67   Pulse 80   Temp 97.8 F (36.6 C)   Wt 126 lb 3.2 oz (57.2 kg)   LMP 03/10/2024   SpO2 98%   BMI 23.08 kg/m   Physical Examination:  General: Awake, alert, well nourished, No acute distress  Assessment/ Plan: 33 y.o. female   Dysuria - Plan: Urinalysis, Routine w reflex microscopic, Urine Culture, WET PREP FOR  TRICH, YEAST, CLUE, clindamycin  (CLEOCIN ) 2 % vaginal cream  Vaginal itching - Plan: WET PREP FOR TRICH, YEAST, CLUE, clindamycin  (CLEOCIN ) 2 % vaginal cream, fluconazole  (DIFLUCAN ) 150 MG tablet  Bacterial vaginosis - Plan: clindamycin  (CLEOCIN ) 2 % vaginal cream  Wet prep negative but I wonder if use of boric acid vaginally may be confounding picture.  Because her symptoms are consistent with her typical BV I am going to empirically treat her with vaginal clindamycin  as she has allergy to Flagyl /metronidazole .  Diflucan  sent for as needed use  Follow-up as needed  Summer CHRISTELLA Fielding, DO [facility] [phone]

## 2024-04-04 NOTE — Telephone Encounter (Signed)
 Pt has appt

## 2024-04-06 ENCOUNTER — Ambulatory Visit: Payer: Self-pay | Admitting: Family Medicine

## 2024-04-06 LAB — URINE CULTURE

## 2024-04-13 ENCOUNTER — Other Ambulatory Visit: Payer: Self-pay | Admitting: Family Medicine

## 2024-04-13 MED ORDER — FLUCONAZOLE 150 MG PO TABS: 150.0000 mg | ORAL_TABLET | Freq: Once | ORAL | 0 refills | Status: AC

## 2024-04-29 DIAGNOSIS — L237 Allergic contact dermatitis due to plants, except food: Secondary | ICD-10-CM | POA: Diagnosis not present

## 2024-05-02 ENCOUNTER — Encounter: Payer: Self-pay | Admitting: Family Medicine

## 2024-05-02 ENCOUNTER — Ambulatory Visit: Payer: Self-pay

## 2024-05-02 ENCOUNTER — Ambulatory Visit: Admitting: Family Medicine

## 2024-05-02 VITALS — BP 110/70 | HR 67 | Temp 98.1°F | Ht 62.0 in | Wt 124.0 lb

## 2024-05-02 DIAGNOSIS — L237 Allergic contact dermatitis due to plants, except food: Secondary | ICD-10-CM

## 2024-05-02 DIAGNOSIS — E109 Type 1 diabetes mellitus without complications: Secondary | ICD-10-CM | POA: Diagnosis not present

## 2024-05-02 MED ORDER — HYDROXYZINE HCL 25 MG PO TABS: 25.0000 mg | ORAL_TABLET | Freq: Three times a day (TID) | ORAL | 0 refills | Status: AC | PRN

## 2024-05-02 MED ORDER — PREDNISONE 20 MG PO TABS: 20.0000 mg | ORAL_TABLET | Freq: Two times a day (BID) | ORAL | 0 refills | Status: AC

## 2024-05-02 NOTE — Progress Notes (Signed)
 Patient Office Visit  Assessment & Plan:  Poison ivy -     predniSONE ; Take 1 tablet (20 mg total) by mouth 2 (two) times daily with a meal for 15 days.  Dispense: 30 tablet; Refill: 0 -     hydrOXYzine  HCl; Take 1 tablet (25 mg total) by mouth 3 (three) times daily as needed.  Dispense: 30 tablet; Refill: 0  Type 1 diabetes mellitus without complication (HCC)   Assessment and Plan    Allergic contact dermatitis due to plants Allergic contact dermatitis likely from poison oak exposure. Symptoms include significant itching and discomfort. Discussed risks of high-dose prednisone  on blood sugar levels due to type 1 diabetes mellitus. - Prescribe prednisone  20 mg twice daily for two days, then 20 mg once daily for five days, adjust based on symptoms and blood sugar. - Prescribe hydroxyzine  for nighttime itching and sleep. - Recommend Claritin or Allegra during the day for itching.  Type 1 diabetes mellitus Type 1 diabetes mellitus with A1c of 7.1. Concern about blood sugar management during steroid treatment for dermatitis. - Monitor blood sugar closely during steroid use.          No follow-ups on file.   Subjective:    Patient ID: Summer Spencer, female    DOB: [birth year]  Age: 33 y.o. MRN: [id number]  Chief Complaint  Patient presents with   Poison Ivy    Bilateral arms x 6 days.    Poison Ivy   Discussed the use of AI scribe software for clinical note transcription with the patient, who gave verbal consent to proceed.  History of Present Illness        Summer Spencer is a 33 year old female with type 1 diabetes who presents with worsening rash after exposure to poison oak.  She developed a rash last Thursday following a camping trip in [place], [place] , where she was exposed to poison oak. She visited urgent care on Sunday and received a steroid injection, likely dexamethasone, which temporarily alleviated symptoms but caused her blood sugar levels to  rise significantly. The rash has been worsening and is described as intensely itchy, affecting her daily activities. She has tried using calamine lotion and a spray for relief, which provides short-lived relief. She also took Benadryl  a day or two ago but did not notice significant improvement.  She has a history of type 1 diabetes, which complicates her treatment options as steroids tend to elevate her blood sugar levels. Her recent A1c was 7.1. She manages her type 1 diabetes through [facility] in [place].  During the review of symptoms, she notes that the rash has not spread to her face, except for two small spots that have not worsened. She also denies insomnia or increased appetite from the steroid use. Physical Exam SKIN: No signs of skin infection. Results LABS   A1c: 7.1% Assessment & Plan Allergic contact dermatitis due to plants Allergic contact dermatitis likely from poison oak exposure. Symptoms include significant itching and discomfort. Discussed risks of high-dose prednisone  on blood sugar levels due to type 1 diabetes mellitus. - Prescribe prednisone  20 mg twice daily for two days, then 20 mg once daily for five days, adjust based on symptoms and blood sugar. - Prescribe hydroxyzine  for nighttime itching and sleep. - Recommend Claritin or Allegra during the day for itching.  Type 1 diabetes mellitus Type 1 diabetes mellitus with A1c of 7.1. Concern about blood sugar management during steroid treatment for dermatitis. - Monitor  blood sugar closely during steroid use.    The ASCVD Risk score (Arnett DK, et al., 2019) failed to calculate for the following reasons:   The 2019 ASCVD risk score is only valid for ages 39 to 59  Past Medical History:  Diagnosis Date   Bronchitis    Diabetes mellitus without complication (HCC) 12/2015   Hypoglycemia    Past Surgical History:  Procedure Laterality Date   hymen     WISDOM TOOTH EXTRACTION     Social History   Tobacco  Use   Smoking status: Former    Current packs/day: 0.00    Average packs/day: 0.3 packs/day for 2.0 years (0.5 ttl pk-yrs)    Types: Cigarettes    Start date: 12/29/2014    Quit date: 12/28/2016    Years since quitting: 7.3   Smokeless tobacco: Never  Vaping Use   Vaping status: Never Used  Substance Use Topics   Alcohol use: Yes    Alcohol/week: 1.0 standard drink of alcohol    Types: 1 Standard drinks or equivalent per week   Drug use: No   Family History  Problem Relation Age of Onset   Hypertension Father    Diabetes Maternal Grandmother    Liver disease Maternal Grandfather    Hypertension Paternal Grandmother    Hypertension Other    Hyperlipidemia Other    Anesthesia problems Neg Hx    Allergies  Allergen Reactions   Metronidazole  Nausea And Vomiting and Rash   Septra [Bactrim] Rash   Sulfamethoxazole Rash   Sulfamethoxazole-Trimethoprim Rash    ROS    Objective:    BP 110/70   Pulse 67   Temp 98.1 F (36.7 C)   Ht 5' 2 (1.575 m)   Wt 124 lb (56.2 kg)   LMP 03/10/2024   SpO2 98%   BMI 22.68 kg/m  BP Readings from Last 3 Encounters:  05/02/24 110/70  04/04/24 111/67  03/01/24 101/63   Wt Readings from Last 3 Encounters:  05/02/24 124 lb (56.2 kg)  04/04/24 126 lb 3.2 oz (57.2 kg)  03/01/24 123 lb (55.8 kg)    Physical Exam Vitals and nursing note reviewed.  Constitutional:      General: She is not in acute distress.    Appearance: Normal appearance.  HENT:     Head: Normocephalic.     Left Ear: Ear canal and external ear normal.  Eyes:     Extraocular Movements: Extraocular movements intact.     Conjunctiva/sclera: Conjunctivae normal.     Pupils: Pupils are equal, round, and reactive to light.  Cardiovascular:     Rate and Rhythm: Normal rate and regular rhythm.     Heart sounds: Normal heart sounds.  Pulmonary:     Effort: Pulmonary effort is normal.     Breath sounds: Normal breath sounds. No wheezing.  Musculoskeletal:     Right  lower leg: No edema.     Left lower leg: No edema.  Skin:    Findings: Erythema and rash present.     Comments: Maculopapular vesicular rash over both forearms, few excoriated lesions noted.   Neurological:     General: No focal deficit present.     Mental Status: She is alert and oriented to person, place, and time.  Psychiatric:        Mood and Affect: Mood normal.        Behavior: Behavior normal.      No results found for any visits on 05/02/24.

## 2024-05-02 NOTE — Telephone Encounter (Signed)
 Apt scheduled.

## 2024-05-02 NOTE — Telephone Encounter (Signed)
 FYI Only or Action Required?: FYI only for provider. - Appt at [facility] today  Patient was last seen in primary care on 04/04/2024 by Jolinda Norene HERO, DO.  Called Nurse Triage reporting [facility], blistering, and Pruritis.  Symptoms began several days ago.  Interventions attempted: OTC medications: ivarich itch spray and Other: UC on Sunday and was given steroid injection.  Symptoms are: gradually worsening.  Triage Disposition: See HCP Within 4 Hours (Or PCP Triage)  Patient/caregiver understands and will follow disposition?: Yes     Copied from CRM #[id number]. Topic: Clinical - Red Word Triage >> May 02, 2024  8:12 AM Avram MATSU wrote: Red Word that prompted transfer to Nurse Triage: poison oak started on hands and now its spreading and very itchy Reason for Disposition  [1] Severe poison ivy, oak, or sumac reaction in the past AND [2] face or genitals involved  Answer Assessment - Initial Assessment Questions 1. APPEARANCE of RASH: What does the rash look like?      In some spots like a line of bumps/blisters, other spots clusters of bubbles 2. LOCATION: Where is the rash located?  (e.g., face, genitals, hands, legs) got wrist on one arm and on forearm going up arm on other arm     2 bumps right under neckline but not itching as much as arms are 3. SIZE: How large is the rash?      Maybe a couple inches in some spots 4. ONSET: When did the rash begin?      Thursday, thought bug bites at first, started with hands then started spreading 5. ITCHING: Does the rash itch? If Yes, ask: How bad is it?     Yes, taking all I can not to itch so probably 10/10 6. EXPOSURE:  How were you exposed to the plant (poison ivy, poison oak, sumac)  When were you exposed?  Note: Sometimes a poison ivy/oak/sumac rash does not appear for 2 to 3 weeks after exposure.      Poison oak 7. PAST HISTORY: Have you had a poison ivy rash before? If Yes, ask: How bad was it?     Yes, last  time had to go get med cuz it was on face, this one seems to be worse than that one 8. OTHER SYMPTOMS: Do you have any other symptoms? (e.g., fever)      No pus or spreading/streaking redness, no fever 9. PREGNANCY: Is there any chance you are pregnant? When was your last menstrual period?     Very slim but yes  Went to UC and got 10 mg steroid injection on Sunday Been using ivarich itch spray helps for 30 min or so at a time    Advised pt be examined in next 4 hours, no PCP office availability, scheduled appt this am at [facility] within region, gave location/appt info.  Protocols used: Poison Ivy - Oak - Sumac-A-AH

## 2024-05-03 MED ORDER — FLUCONAZOLE 150 MG PO TABS
ORAL_TABLET | ORAL | 0 refills | Status: AC
Start: 2024-05-03 — End: ?

## 2024-05-07 ENCOUNTER — Other Ambulatory Visit (INDEPENDENT_AMBULATORY_CARE_PROVIDER_SITE_OTHER): Admitting: Pharmacist

## 2024-05-07 DIAGNOSIS — E1065 Type 1 diabetes mellitus with hyperglycemia: Secondary | ICD-10-CM

## 2024-05-07 NOTE — Progress Notes (Signed)
 Pharmacy Quality Measure Review  This patient is appearing on a report for being at risk of failing the Glycemic Status Assessment in Diabetes measure this calendar year.   Last documented A1c or GMI 7.1% on 03/14/24 at [facility].   A1c near goal and follow up with endocrinology is scheduled. No action needed at this time.   Catie [facility], PharmD, [facility] Clinical Pharmacist [phone]

## 2024-06-06 ENCOUNTER — Ambulatory Visit: Payer: Self-pay

## 2024-06-06 ENCOUNTER — Encounter: Payer: Self-pay | Admitting: Nurse Practitioner

## 2024-06-06 ENCOUNTER — Ambulatory Visit: Admitting: Nurse Practitioner

## 2024-06-06 VITALS — BP 104/65 | HR 92 | Temp 98.0°F | Ht 62.0 in | Wt 124.2 lb

## 2024-06-06 DIAGNOSIS — Z23 Encounter for immunization: Secondary | ICD-10-CM

## 2024-06-06 DIAGNOSIS — R300 Dysuria: Secondary | ICD-10-CM

## 2024-06-06 DIAGNOSIS — N760 Acute vaginitis: Secondary | ICD-10-CM | POA: Diagnosis not present

## 2024-06-06 LAB — URINALYSIS, COMPLETE
Bilirubin, UA: NEGATIVE
Glucose, UA: NEGATIVE
Leukocytes,UA: NEGATIVE
Nitrite, UA: NEGATIVE
Protein,UA: NEGATIVE
RBC, UA: NEGATIVE
Specific Gravity, UA: <=1.005 — AB (ref 1.005–1.030)
Urobilinogen, Ur: 0.2 mg/dL (ref 0.2–1.0)
pH, UA: 6 (ref 5.0–7.5)

## 2024-06-06 LAB — WET PREP FOR TRICH, YEAST, CLUE
Clue Cell Exam: NEGATIVE
Trichomonas Exam: NEGATIVE
Yeast Exam: NEGATIVE

## 2024-06-06 LAB — MICROSCOPIC EXAMINATION
Bacteria, UA: NONE SEEN
RBC, Urine: NONE SEEN /HPF (ref 0–2)
Renal Epithel, UA: NONE SEEN /HPF
Yeast, UA: NONE SEEN

## 2024-06-06 NOTE — Telephone Encounter (Signed)
 FYI Only or Action Required?: FYI only for provider.  Patient was last seen in primary care on 05/02/2024 by Aletha Bene, MD.  Called Nurse Triage reporting Vaginal Itching.  Symptoms began several days ago.  Interventions attempted: OTC medications: Boric acid suppository.  Symptoms are: vaginal itching, irritation, redness gradually worsening.  Triage Disposition: See Physician Within 24 Hours  Patient/caregiver understands and will follow disposition?: Yes            Message from [facility] sent at 06/06/2024  8:05 AM EDT   itching, redness, slight burning ( not when she pees )..says possible Yeast infection   Reason for Disposition  MODERATE-SEVERE itching (i.e., interferes with school, work, or sleep)  Answer Assessment - Initial Assessment Questions 1. SYMPTOM: What's the main symptom you're concerned about? (e.g., pain, itching, dryness)     Itching, redness, burning.  2. LOCATION: Where is the  itching/burning located? (e.g., inside/outside, left/right)     More towards the right side and bottom. 3. ONSET: When did the  symptoms  start?     Friday.  4. PAIN: Is there any pain? If Yes, ask: How bad is it? (Scale: 1-10; mild, moderate, severe)     6/10.  5. ITCHING: Is there any itching? If Yes, ask: How bad is it? (Scale: 1-10; mild, moderate, severe)     6/10.  6. CAUSE: What do you think is causing the discharge? Have you had the same problem before? What happened then?     Yeast infection or BV. She states she has had both and they present similarly for her. She states she usually has to get 2 rounds of treatment to cover the infection.  7. OTHER SYMPTOMS: Do you have any other symptoms? (e.g., fever, itching, vaginal bleeding, pain with urination, injury to genital area, vaginal foreign body)     Denies fever, abnormal vaginal bleeding, painful urination, vaginal discharge, vaginal foreign body.  8. PREGNANCY: Is there any chance  you are pregnant? When was your last menstrual period?     LMP: 05/11/24.  Protocols used: Vaginal Symptoms-A-AH

## 2024-06-06 NOTE — Progress Notes (Signed)
 Subjective:  Patient ID: Summer Spencer, female    DOB: 1991-02-20, 33 y.o.   MRN: [id number]  Patient Care Team: Gladis Mustard, FNP as PCP - General (Family Medicine) Ob/Gyn, [facility], [employer] [phone])   Chief Complaint:  Vaginitis   HPI: Summer Spencer is a 33 y.o. female presenting on 06/06/2024 for Vaginitis   Discussed the use of AI scribe software for clinical note transcription with the patient, who gave verbal consent to proceed.  History of Present Illness Summer Spencer is a 33 year old female who presents with symptoms suggestive of a possible yeast infection or bacterial vaginosis.  She experiences frequent episodes of vaginal burning and itching, with the current episode beginning on Friday while she was out of town at [employer]. The symptoms are described as burning and itching.  She attempted to treat the symptoms with boric acid suppositories at home, but the symptoms have not resolved. No discharge, fever, pressure, or pain with urination.  A previous work-up included a wet prep which was negative for Glucel, Fortric, and yeast, but showed a slight white blood cell count and a few bacteria. Will order UA    Relevant past medical, surgical, family, and social history reviewed and updated as indicated.  Allergies and medications reviewed and updated. Data reviewed: Chart in Epic.   Past Medical History:  Diagnosis Date   Bronchitis    Diabetes mellitus without complication (HCC) 12/2015   Hypoglycemia     Past Surgical History:  Procedure Laterality Date   hymen     WISDOM TOOTH EXTRACTION      Social History   Socioeconomic History   Marital status: Married    Spouse name: Not on file   Number of children: Not on file   Years of education: Not on file   Highest education level: Some college, no degree  Occupational History   Not on file  Tobacco Use   Smoking status: Former    Current packs/day: 0.00     Average packs/day: 0.3 packs/day for 2.0 years (0.5 ttl pk-yrs)    Types: Cigarettes    Start date: 12/29/2014    Quit date: 12/28/2016    Years since quitting: 7.4   Smokeless tobacco: Never  Vaping Use   Vaping status: Never Used  Substance and Sexual Activity   Alcohol use: Yes    Alcohol/week: 1.0 standard drink of alcohol    Types: 1 Standard drinks or equivalent per week   Drug use: No   Sexual activity: Yes  Other Topics Concern   Not on file  Social History Narrative   Not on file   Social Drivers of Health   Financial Resource Strain: Low Risk  (02/29/2024)   Overall Financial Resource Strain (CARDIA)    Difficulty of Paying Living Expenses: Not hard at all  Food Insecurity: No Food Insecurity (02/29/2024)   Hunger Vital Sign    Worried About Running Out of Food in the Last Year: Never true    Ran Out of Food in the Last Year: Never true  Transportation Needs: No Transportation Needs (02/29/2024)   PRAPARE - Administrator, Civil Service (Medical): No    Lack of Transportation (Non-Medical): No  Physical Activity: Insufficiently Active (02/29/2024)   Exercise Vital Sign    Days of Exercise per Week: 3 days    Minutes of Exercise per Session: 20 min  Stress: No Stress Concern Present (02/29/2024)  Summer Spencer [employer] - Occupational Stress Questionnaire    Feeling of Stress: Not at all  Social Connections: Socially Integrated (02/29/2024)   Social Connection and Isolation Panel    Frequency of Communication with Friends and Family: Three times a week    Frequency of Social Gatherings with Friends and Family: Once a week    Attends Religious Services: More than 4 times per year    Active Member of [employer] or Organizations: Yes    Attends Engineer, structural: More than 4 times per year    Marital Status: Married  Catering manager Violence: Not At Risk (09/23/2022)   Humiliation, Afraid, Rape, and Kick questionnaire    Fear of Current  or Ex-Partner: No    Emotionally Abused: No    Physically Abused: No    Sexually Abused: No    Outpatient Encounter Medications as of 06/06/2024  Medication Sig   BAQSIMI ONE PACK 3 MG/DOSE POWD Place 1 spray into the nose as needed (hypoglycemia).   Blood Glucose Monitoring Suppl (FIFTY50 GLUCOSE METER 2.0) w/Device KIT 1 each by Other route as directed. Uses when not using Dexcom   Continuous Glucose Transmitter (EVERSENSE E3 SMART TRANSMITTER) MISC by Does not apply route.   fluconazole  (DIFLUCAN ) 150 MG tablet 1 po q week x 4 weeks   glucose blood (CONTOUR NEXT TEST) test strip 1 each by Other route 4 (four) times daily. Uses when not using Dexcom   hydrOXYzine  (ATARAX ) 25 MG tablet Take 1 tablet (25 mg total) by mouth 3 (three) times daily as needed.   Insulin  Disposable Pump (OMNIPOD 5 G6 POD, GEN 5,) MISC Inject 1 each into the skin every 3 (three) days.   metoCLOPramide  (REGLAN ) 5 MG tablet Take 1 tablet (5 mg total) by mouth 4 (four) times daily.   NOVOLOG  100 UNIT/ML injection SMARTSIG:0-30 Unit(s) SUB-Q Daily   TRESIBA FLEXTOUCH 100 UNIT/ML FlexTouch Pen SMARTSIG:15 Unit(s) SUB-Q Daily   No facility-administered encounter medications on file as of 06/06/2024.    Allergies  Allergen Reactions   Metronidazole  Nausea And Vomiting and Rash   Septra [Bactrim] Rash   Sulfamethoxazole Rash   Sulfamethoxazole-Trimethoprim Rash    Pertinent ROS per HPI, otherwise unremarkable      Objective:  BP 104/65   Pulse 92   Temp 98 F (36.7 C)   Ht 5' 2 (1.575 m)   Wt 124 lb 3.2 oz (56.3 kg)   SpO2 95%   BMI 22.72 kg/m    Wt Readings from Last 3 Encounters:  06/06/24 124 lb 3.2 oz (56.3 kg)  05/02/24 124 lb (56.2 kg)  04/04/24 126 lb 3.2 oz (57.2 kg)    Physical Exam Vitals and nursing note reviewed.  Constitutional:      General: She is not in acute distress. HENT:     Head: Normocephalic and atraumatic.     Nose: Nose normal.  Eyes:     Extraocular Movements:  Extraocular movements intact.     Conjunctiva/sclera: Conjunctivae normal.     Pupils: Pupils are equal, round, and reactive to light.  Cardiovascular:     Heart sounds: Normal heart sounds.  Pulmonary:     Effort: Pulmonary effort is normal.     Breath sounds: Normal breath sounds.  Abdominal:     General: Bowel sounds are normal.     Palpations: Abdomen is soft.     Tenderness: There is no abdominal tenderness.  Musculoskeletal:        General:  Normal range of motion.     Right lower leg: No edema.     Left lower leg: No edema.  Skin:    General: Skin is warm and dry.     Findings: No rash.  Neurological:     Mental Status: She is alert and oriented to person, place, and time.  Psychiatric:        Mood and Affect: Mood normal.        Behavior: Behavior normal.        Judgment: Judgment normal.    Physical Exam      Results for orders placed or performed in visit on 06/06/24  WET PREP FOR TRICH, YEAST, CLUE   Collection Time: 06/06/24  2:15 PM   Specimen: Vaginal Swab   Vaginal Swab  Result Value Ref Range   Trichomonas Exam Negative Negative   Yeast Exam Negative Negative   Clue Cell Exam Negative Negative      Urine dipstick shows positive for leukocytes trace.  Micro exam: 0-2 WBC's per HPF.   Pertinent labs & imaging results that were available during my care of the patient were reviewed by me and considered in my medical decision making.  Assessment & Plan:  Summer Spencer was seen today for vaginitis.  Diagnoses and all orders for this visit:  Acute vaginitis -     WET PREP FOR TRICH, YEAST, CLUE -     Urinalysis, Complete     Assessment and Plan Summer Spencer is a 33 year old Caucasian female seen today for vaginitis, no acute distress Assessment & Plan Vaginal symptoms (burning, itching) Symptoms of burning and itching without discharge, fever, pressure, or dysuria. Wet prep negative for yeast and bacterial vaginosis. Slight leukocytosis and few bacteria  present.  Possible urinary tract infection Symptoms align with previous episodes of UTI despite lack of definitive signs. Slight leukocytosis and few bacteria noted. - Order urinalysis to rule out UTI.  Negative for UTI - avoid boric acidas some commnon side effect are  Common Side Effects: -Vaginal burning or irritation (most common) -Watery discharge -Redness or soreness of the vaginal area -Mild cramping or discomfort -  Health maintenance: Flu vaccine administered today  Continue all other maintenance medications.  Follow up plan: Return if symptoms worsen or fail to improve.   Continue healthy lifestyle choices, including diet (rich in fruits, vegetables, and lean proteins, and low in salt and simple carbohydrates) and exercise (at least 30 minutes of moderate physical activity daily).  Educational handout given for    Irritation of the Vagina (Vaginitis): What to Know  Vaginitis is irritation and swelling of the vagina. It happens when the usual balance of bacteria and yeast in the vagina changes. This change causes some types to grow too much. This overgrowth leads to vaginitis. What are the causes? Bacteria. Yeast, which is a fungus. A parasite. A virus. Low hormones in the body. This can occur during pregnancy, breastfeeding, or after menopause. What increases the risk? Irritants, such as douches, bubble baths, scented tampons, and feminine sprays. Antibiotics. Poor hygiene. Wearing tight pants or thong underwear. Some birth control methods, such as diaphragms, vaginal sponges, or spermicides. Having sex without a condom or having sex with more than one person. Infections. Uncontrolled diabetes. What are the signs or symptoms? Abnormal fluid from the vagina. The fluid may be: White, gray, or yellow. Thick, white, and cheesy. Frothy and yellow or green. A bad smell from the vagina. Itching, pain, or swelling in the vagina. Pain during sex.  Pain or burning  when you pee. How is this diagnosed? This condition is diagnosed based on your symptoms, medical history, and an exam. This may include a pelvic exam. Tests may also be done. Tests may be done to: Check the pH level of your vagina. Check the fluid in your vagina. How is this treated? Treatment will depend on what is causing your vaginitis. Treatment may include: Antibiotics. Antifungal medicines. Medicines to treat symptoms if you have a virus. Your sex partner should also be treated. Estrogen medicines. Medicines to treat allergies. The medicines may be pills or creams. Follow these instructions at home: Lifestyle Keep the area around your vagina clean and dry. Avoid using soap. Rinse the area with water. Until your health care provider says it's okay: Do not douche. Do not use tampons. Use pads, if needed. Do not have sex. Wipe from front to back after going to the bathroom. When the provider says it's okay, practice safe sex. Use condoms. General instructions Take your medicines only as told. If you were given antibiotics, take them as told. Do not stop taking them even if you start to feel better. How is this prevented? Use mild, unscented products. Avoid the following products if they are scented: Sprays. Detergents. Tampons. Products for cleaning the vagina. Soaps or bubble baths. Let air reach your genital area. To do this: Wear cotton underwear. Do not wear underwear while you sleep. Do not wear tight pants and underwear or pantyhose without a cotton panel. Do not wear thong underwear. Take off any wet clothing, such as bathing suits, as soon as possible. Practice safe sex. Use condoms. Contact a health care provider if: You have pain in the belly or around the pelvis. You have a fever or chills. You have symptoms that last for more than 2-3 days. This information is not intended to replace advice given to you by your health care provider. Make sure you discuss any  questions you have with your health care provider. Document Revised: 05/19/2023 Document Reviewed: 12/27/2022 Elsevier Patient Education  2024 Elsevier Inc.    The above assessment and management plan was discussed with the patient. The patient verbalized understanding of and has agreed to the management plan. Patient is aware to call the clinic if they develop any new symptoms or if symptoms persist or worsen. Patient is aware when to return to the clinic for a follow-up visit. Patient educated on when it is appropriate to go to the emergency department.   Summer Judon [place] Thompson, DNP [facility] [address] [place], [place] [address] [phone]

## 2024-06-07 DIAGNOSIS — N760 Acute vaginitis: Secondary | ICD-10-CM | POA: Diagnosis not present

## 2024-06-29 ENCOUNTER — Telehealth (INDEPENDENT_AMBULATORY_CARE_PROVIDER_SITE_OTHER): Payer: Self-pay | Admitting: Otolaryngology

## 2024-06-29 ENCOUNTER — Encounter (INDEPENDENT_AMBULATORY_CARE_PROVIDER_SITE_OTHER): Payer: Self-pay | Admitting: Otolaryngology

## 2024-06-29 ENCOUNTER — Ambulatory Visit (INDEPENDENT_AMBULATORY_CARE_PROVIDER_SITE_OTHER): Admitting: Otolaryngology

## 2024-06-29 ENCOUNTER — Telehealth (INDEPENDENT_AMBULATORY_CARE_PROVIDER_SITE_OTHER): Payer: Self-pay

## 2024-06-29 VITALS — BP 114/76 | HR 76 | Ht 62.0 in | Wt 124.0 lb

## 2024-06-29 DIAGNOSIS — B370 Candidal stomatitis: Secondary | ICD-10-CM

## 2024-06-29 DIAGNOSIS — K146 Glossodynia: Secondary | ICD-10-CM

## 2024-06-29 DIAGNOSIS — K219 Gastro-esophageal reflux disease without esophagitis: Secondary | ICD-10-CM

## 2024-06-29 MED ORDER — NYSTATIN 100000 UNIT/ML MT SUSP: 5.0000 mL | Freq: Three times a day (TID) | OROMUCOSAL | 0 refills | Status: AC

## 2024-06-29 NOTE — Progress Notes (Signed)
 Reason for Consult:tonsillitis Referring Physician: Dr Gladis Ileana [name] is an 33 y.o. female.  HPI: She is here for evaluation of a rough feeling in her throat and a little bit of sore throat in the morning.  She has had this for several months.  She is a diabetic but mostly her sugars are under control.  She has had many episodes of yeast infection and that usually when her sugars are out of control.  This throat problem is not associated with any dysphagia or odynophagia.  She has not had any antibiotics.  She has had no change in medications and besides the diabetes she is not aware of any immunodeficiency.  She was tried on Magic mouthwash in the sensation her throat did resolve.  She has a burning feeling in the tongue that is intermittent.  No nasal obstruction or congestion.  No change in her voice.  Past Medical History:  Diagnosis Date   Bronchitis    Diabetes mellitus without complication (HCC) 12/2015   Hypoglycemia     Past Surgical History:  Procedure Laterality Date   hymen     WISDOM TOOTH EXTRACTION      Family History  Problem Relation Age of Onset   Hypertension Father    Diabetes Maternal Grandmother    Liver disease Maternal Grandfather    Hypertension Paternal Grandmother    Hypertension Other    Hyperlipidemia Other    Anesthesia problems Neg Hx     Social History:  reports that she quit smoking about 7 years ago. Her smoking use included cigarettes. She started smoking about 9 years ago. She has a 0.5 pack-year smoking history. She has never used smokeless tobacco. She reports current alcohol use of about 1.0 standard drink of alcohol per week. She reports that she does not use drugs.  Allergies:  Allergies  Allergen Reactions   Metronidazole  Nausea And Vomiting and Rash   Septra [Bactrim] Rash   Sulfamethoxazole Rash   Sulfamethoxazole-Trimethoprim Rash    Medications: I have reviewed the patient's current medications.  No results found for  this or any previous visit (from the past 48 hours).  No results found.  ROS There were no vitals taken for this visit. Physical Exam Constitutional:      Appearance: Normal appearance.  HENT:     Head: Normocephalic and atraumatic.     Right Ear: Tympanic membrane is without lesions and middle ear aerated, ear canal and external ear normal.     Left Ear: Tympanic membrane is without lesions and middle ear aerated, ear canal and external ear normal.     Nose: Nose normal. Turbinates with mild hypertrophy, No significant swelling or masses.     Oral cavity/oropharynx: There is not any obvious evidence of fungal infection but she does have small little mucocele like areas on both tonsillar fossa does.  They are very small 1 mm in size and equally dispersed across the mucosa.  Mucous membranes are moist. No lesions or masses    Larynx: normal voice. Mirror attempted without success    Eyes:     Extraocular Movements: Extraocular movements intact.     Conjunctiva/sclera: Conjunctivae normal.     Pupils: Pupils are equal, round, and reactive to light.  Cardiovascular:     Rate and Rhythm: Normal rate.  Pulmonary:     Effort: Pulmonary effort is normal.  Musculoskeletal:     Cervical back: Normal range of motion and neck supple. No rigidity.  Lymphadenopathy:  Cervical: No cervical adenopathy or masses.salivary glands without lesions. .  Neurological:     Mental Status: He is alert. CN 2-12 intact. No nystagmus      Assessment/Plan: Thrush-I do not see any evidence currently of obvious thrush but she did get better with Magic mouthwash.  We talked about trying that again to see if this second treatment would also give her complete relief.  She is in agreement with that.  We talked about the other possibilities of burning mouth syndrome which also was related to fungal infections and diabetes.  We talked about reflux as a possibility of her throat symptoms.  For now we will try the  mouthwash first and then try reflux treatment next.  She will call if she is not resolved in a couple of weeks from the mouth rinse treatment.  She is in agreement with this plan and will follow-up as the symptoms persist.  [name] 06/29/2024, 11:20 AM

## 2024-06-29 NOTE — Telephone Encounter (Signed)
 Pharmacy called in and left message stating that they do not do compounding and the prescription needs to go to another pharmacy.  Any questions, call Josette - [phone]

## 2024-06-29 NOTE — Telephone Encounter (Signed)
 Pt called stating her pharmacy will not fill a compound

## 2024-07-11 DIAGNOSIS — Z6822 Body mass index (BMI) 22.0-22.9, adult: Secondary | ICD-10-CM | POA: Diagnosis not present

## 2024-07-11 DIAGNOSIS — Z9641 Presence of insulin pump (external) (internal): Secondary | ICD-10-CM | POA: Diagnosis not present

## 2024-07-11 DIAGNOSIS — Z978 Presence of other specified devices: Secondary | ICD-10-CM | POA: Diagnosis not present

## 2024-07-11 DIAGNOSIS — Z01419 Encounter for gynecological examination (general) (routine) without abnormal findings: Secondary | ICD-10-CM | POA: Diagnosis not present

## 2024-07-11 DIAGNOSIS — E10649 Type 1 diabetes mellitus with hypoglycemia without coma: Secondary | ICD-10-CM | POA: Diagnosis not present

## 2024-07-17 DIAGNOSIS — E1065 Type 1 diabetes mellitus with hyperglycemia: Secondary | ICD-10-CM | POA: Diagnosis not present

## 2024-07-25 DIAGNOSIS — Z1231 Encounter for screening mammogram for malignant neoplasm of breast: Secondary | ICD-10-CM | POA: Diagnosis not present

## 2024-08-21 MED ORDER — OSELTAMIVIR PHOSPHATE 75 MG PO CAPS: 75.0000 mg | ORAL_CAPSULE | Freq: Two times a day (BID) | ORAL | 0 refills | Status: AC

## 2024-09-17 ENCOUNTER — Ambulatory Visit: Admitting: Nurse Practitioner

## 2024-09-17 ENCOUNTER — Encounter: Payer: Self-pay | Admitting: Nurse Practitioner
